# Patient Record
Sex: Female | Born: 1990 | Race: Black or African American | Hispanic: No | Marital: Married | Smoking: Never smoker
Health system: Southern US, Community
[De-identification: ages and names within clinical notes are randomized; demographics above are authoritative.]

## PROBLEM LIST (undated history)

## (undated) ENCOUNTER — Inpatient Hospital Stay (HOSPITAL_COMMUNITY): Payer: Self-pay

## (undated) ENCOUNTER — Inpatient Hospital Stay (HOSPITAL_COMMUNITY): Payer: 59

## (undated) DIAGNOSIS — Z789 Other specified health status: Secondary | ICD-10-CM

## (undated) HISTORY — PX: HERNIA REPAIR: SHX51

---
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
## 2008-08-05 ENCOUNTER — Emergency Department (HOSPITAL_COMMUNITY): Admission: EM | Admit: 2008-08-05 | Discharge: 2008-08-05 | Payer: Self-pay | Admitting: Family Medicine

## 2008-10-22 ENCOUNTER — Emergency Department (HOSPITAL_COMMUNITY): Admission: EM | Admit: 2008-10-22 | Discharge: 2008-10-23 | Payer: Self-pay | Admitting: Emergency Medicine

## 2010-01-25 ENCOUNTER — Emergency Department (HOSPITAL_COMMUNITY): Admission: EM | Admit: 2010-01-25 | Discharge: 2010-01-25 | Payer: Self-pay | Admitting: Emergency Medicine

## 2010-09-18 LAB — URINALYSIS, ROUTINE W REFLEX MICROSCOPIC
Bilirubin Urine: NEGATIVE
Glucose, UA: NEGATIVE mg/dL
Hgb urine dipstick: NEGATIVE
Ketones, ur: NEGATIVE mg/dL
Nitrite: NEGATIVE
Protein, ur: NEGATIVE mg/dL
Specific Gravity, Urine: 1.014 (ref 1.005–1.030)
Urobilinogen, UA: 1 mg/dL (ref 0.0–1.0)
pH: 7 (ref 5.0–8.0)

## 2010-09-18 LAB — DIFFERENTIAL
Basophils Absolute: 0 10*3/uL (ref 0.0–0.1)
Basophils Relative: 1 % (ref 0–1)
Eosinophils Absolute: 0 10*3/uL (ref 0.0–0.7)
Eosinophils Relative: 1 % (ref 0–5)
Lymphocytes Relative: 46 % (ref 12–46)
Lymphs Abs: 1.7 10*3/uL (ref 0.7–4.0)
Monocytes Absolute: 0.3 10*3/uL (ref 0.1–1.0)
Monocytes Relative: 8 % (ref 3–12)
Neutro Abs: 1.6 10*3/uL — ABNORMAL LOW (ref 1.7–7.7)
Neutrophils Relative %: 44 % (ref 43–77)

## 2010-09-18 LAB — COMPREHENSIVE METABOLIC PANEL
ALT: 11 U/L (ref 0–35)
AST: 20 U/L (ref 0–37)
Albumin: 3.9 g/dL (ref 3.5–5.2)
Alkaline Phosphatase: 30 U/L — ABNORMAL LOW (ref 39–117)
BUN: 9 mg/dL (ref 6–23)
CO2: 26 mEq/L (ref 19–32)
Calcium: 8.9 mg/dL (ref 8.4–10.5)
Chloride: 106 mEq/L (ref 96–112)
Creatinine, Ser: 0.64 mg/dL (ref 0.4–1.2)
GFR calc Af Amer: >60 mL/min (ref >60)
GFR calc non Af Amer: >60 mL/min (ref >60)
Glucose, Bld: 87 mg/dL (ref 70–99)
Potassium: 3.9 mEq/L (ref 3.5–5.1)
Sodium: 137 mEq/L (ref 135–145)
Total Bilirubin: 1.1 mg/dL (ref 0.3–1.2)
Total Protein: 6.8 g/dL (ref 6.0–8.3)

## 2010-09-18 LAB — CBC
MCHC: 32.5 g/dL (ref 30.0–36.0)
Platelets: 190 10*3/uL (ref 150–400)
RDW: 15 % (ref 11.5–15.5)
WBC: 3.8 10*3/uL — ABNORMAL LOW (ref 4.0–10.5)

## 2010-09-18 LAB — GC/CHLAMYDIA PROBE AMP, GENITAL
Chlamydia, DNA Probe: POSITIVE — AB
GC Probe Amp, Genital: NEGATIVE

## 2010-09-18 LAB — POCT PREGNANCY, URINE: Preg Test, Ur: NEGATIVE

## 2010-10-13 LAB — URINALYSIS, ROUTINE W REFLEX MICROSCOPIC
Nitrite: NEGATIVE
Specific Gravity, Urine: 1.009 (ref 1.005–1.030)
Urobilinogen, UA: 1 mg/dL (ref 0.0–1.0)
pH: 7 (ref 5.0–8.0)

## 2010-10-13 LAB — URINE MICROSCOPIC-ADD ON

## 2010-10-13 LAB — COMPREHENSIVE METABOLIC PANEL
AST: 20 U/L (ref 0–37)
Albumin: 4.2 g/dL (ref 3.5–5.2)
Calcium: 9.8 mg/dL (ref 8.4–10.5)
Creatinine, Ser: 0.66 mg/dL (ref 0.4–1.2)
GFR calc Af Amer: >60 mL/min (ref >60)

## 2010-10-13 LAB — DIFFERENTIAL
Eosinophils Relative: 1 % (ref 0–5)
Lymphocytes Relative: 51 % — ABNORMAL HIGH (ref 12–46)
Lymphs Abs: 3.2 10*3/uL (ref 0.7–4.0)
Monocytes Absolute: 0.4 10*3/uL (ref 0.1–1.0)
Monocytes Relative: 6 % (ref 3–12)

## 2010-10-13 LAB — CBC
MCHC: 31.8 g/dL (ref 30.0–36.0)
MCV: 72.9 fL — ABNORMAL LOW (ref 78.0–100.0)
Platelets: 212 10*3/uL (ref 150–400)
WBC: 6.3 10*3/uL (ref 4.0–10.5)

## 2010-10-13 LAB — LIPASE, BLOOD: Lipase: 22 U/L (ref 11–59)

## 2010-11-19 NOTE — Discharge Summary (Signed)
Megan Skinner, Megan Skinner               ACCOUNT NO.:  [id number]   MEDICAL RECORD NO.:  [id number]          PATIENT TYPE:  INP   LOCATION:  6148                         FACILITY:  [facility]   PHYSICIAN:  Louise A. Twiselton, M.D.DATE OF BIRTH:  December 10, 1990   DATE OF ADMISSION:  06/03/2006  DATE OF DISCHARGE:  06/06/2006                               DISCHARGE SUMMARY   REASON FOR HOSPITALIZATION:  Diarrhea.   SIGNIFICANT FINDINGS:  A 20 year old African-American female with no  significant past medical history admitted with four days of watery  progressing to mucusy diarrhea, anorexia, diffuse abdominal pain  radiating to back, one episode of frank blood in underwear, and blood-  streaked toilet paper.  Last menstrual period ended four days prior to  admission and is generally regular.  Hemoccult was positive at PCP on  day of admission, but negative in hospital.  Margaretann Loveless antigen was negative.  UA showed large blood, no RBCs, no proteins, small LE.  Fecal  lactoferrin was positive, urine HCG was negative.  Patient was treated  with fluids and ibuprofen.  Urine stool cultures and EHEC toxin were all  negative at time of discharge.  Bloody discharge was discovered to be  vaginal in origin.  Patient had markedly improved abdominal pain, was  tolerating p.o. and diarrhea had resolved on discharge.   No operations or procedures.   FINAL DIAGNOSES:  1. Gastroenteritis/colitis.  2. Irregular menstrual bleeding.   DISCHARGE MEDICATIONS AND INSTRUCTIONS:  Ibuprofen 600 mg p.o. q.6.h.  p.r.n. for pain.   PENDING RESULTS AND ISSUES TO BE FOLLOWED:  1. Urine and stool cultures.  2. Follow up with Dr. Tama High as needed.   DISCHARGE WEIGHT:  56 kilograms.   DISCHARGE CONDITION:  Improved.     ______________________________  Enid Cutter, Resident, Pediatrics    ______________________________  Duanne Guess Twiselton, M.D.    Erick Alley  D:  06/06/2006  T:  06/07/2006  Job:  [id number]

## 2011-04-19 ENCOUNTER — Inpatient Hospital Stay (INDEPENDENT_AMBULATORY_CARE_PROVIDER_SITE_OTHER)
Admission: RE | Admit: 2011-04-19 | Discharge: 2011-04-19 | Disposition: A | Discharge status: Home / Self Care | Payer: 59 | Source: Ambulatory Visit | Attending: Family Medicine | Admitting: Family Medicine

## 2011-04-19 DIAGNOSIS — J069 Acute upper respiratory infection, unspecified: Secondary | ICD-10-CM

## 2011-04-19 DIAGNOSIS — M25569 Pain in unspecified knee: Secondary | ICD-10-CM

## 2011-09-21 ENCOUNTER — Encounter: Payer: Self-pay | Admitting: Obstetrics and Gynecology

## 2012-05-01 LAB — OB RESULTS CONSOLE GC/CHLAMYDIA
Chlamydia: POSITIVE
Gonorrhea: NEGATIVE

## 2012-05-01 LAB — OB RESULTS CONSOLE RUBELLA ANTIBODY, IGM: Rubella: IMMUNE

## 2012-05-01 LAB — OB RESULTS CONSOLE HEPATITIS B SURFACE ANTIGEN: Hepatitis B Surface Ag: NEGATIVE

## 2012-07-12 LAB — OB RESULTS CONSOLE GC/CHLAMYDIA
Chlamydia: NEGATIVE
Gonorrhea: NEGATIVE

## 2012-08-26 ENCOUNTER — Inpatient Hospital Stay (HOSPITAL_COMMUNITY)
Admission: AD | Admit: 2012-08-26 | Discharge: 2012-08-26 | Disposition: A | Discharge status: Home / Self Care | Payer: BC Managed Care – PPO | Source: Ambulatory Visit | Attending: Obstetrics and Gynecology | Admitting: Obstetrics and Gynecology

## 2012-08-26 ENCOUNTER — Encounter (HOSPITAL_COMMUNITY): Payer: Self-pay | Admitting: *Deleted

## 2012-08-26 DIAGNOSIS — R109 Unspecified abdominal pain: Secondary | ICD-10-CM | POA: Insufficient documentation

## 2012-08-26 DIAGNOSIS — O368190 Decreased fetal movements, unspecified trimester, not applicable or unspecified: Secondary | ICD-10-CM | POA: Insufficient documentation

## 2012-08-26 LAB — URINALYSIS, ROUTINE W REFLEX MICROSCOPIC
Bilirubin Urine: NEGATIVE
Glucose, UA: NEGATIVE mg/dL
Hgb urine dipstick: NEGATIVE
Ketones, ur: NEGATIVE mg/dL
Nitrite: NEGATIVE
Protein, ur: NEGATIVE mg/dL
Specific Gravity, Urine: 1.01 (ref 1.005–1.030)
Urobilinogen, UA: 0.2 mg/dL (ref 0.0–1.0)
pH: 6 (ref 5.0–8.0)

## 2012-08-26 LAB — WET PREP, GENITAL: Trich, Wet Prep: NONE SEEN

## 2012-08-26 LAB — URINE MICROSCOPIC-ADD ON

## 2012-08-26 NOTE — MAU Provider Note (Signed)
History     CSN: [id number]  Arrival date and time: 08/26/12 7829   First Provider Initiated Contact with Patient 08/26/12 2006      Chief Complaint  Patient presents with  . Decreased Fetal Movement   HPI [name] is a 22 y.o. G1P0 at [redacted]w[redacted]d who presents to MAU today with complaint of abdominal pressure and decreased fetal movement. The patient states that she has felt increased pressure in her lower abdomen every few hours today. She has not felt the baby move since 10 am today and this is unusual for her. She also feels a tightness across the abdomen, although this remains constant throughout most of the day and does not come and go like a contraction. She denies contractions, vaginal bleeding, abnormal discharge or LOF. She has consumed a normal diet today and feels that she is well hydrated.   OB History   Grav Para Term Preterm Abortions TAB SAB Ect Mult Living   1               History reviewed. No pertinent past medical history.  Past Surgical History  Procedure Laterality Date  . Abdominal hernia repair    . No past surgeries      History reviewed. No pertinent family history.  History  Substance Use Topics  . Smoking status: Never Smoker   . Smokeless tobacco: Not on file  . Alcohol Use: No    Allergies: No Known Allergies  Prescriptions prior to admission  Medication Sig Dispense Refill  . ferrous sulfate 325 (65 FE) MG tablet Take 325 mg by mouth daily with breakfast.      . Prenatal Vit-Fe Fumarate-FA (PRENATAL MULTIVITAMIN) TABS Take 1 tablet by mouth every morning.        Review of Systems  Constitutional: Negative for fever and malaise/fatigue.  Gastrointestinal: Positive for nausea and abdominal pain. Negative for vomiting.  Genitourinary: Negative for dysuria, urgency and frequency.       Neg - vaginal bleeding Neg - abnormal vaginal discharge   Physical Exam   Blood pressure 117/72, pulse 95, temperature 99 F (37.2 C),  temperature source Oral, resp. rate 20, height 5\' 3"  (1.6 m), weight 156 lb (70.761 kg), SpO2 100.00%.  Physical Exam  Constitutional: She is oriented to person, place, and time. She appears well-developed and well-nourished. No distress.  HENT:  Head: Normocephalic and atraumatic.  Cardiovascular: Normal rate, regular rhythm and normal heart sounds.   Respiratory: Effort normal and breath sounds normal. No respiratory distress.  GI: Soft. Bowel sounds are normal. She exhibits no distension and no mass. There is tenderness (mild suprapubic tenderness to palpation). There is no rebound and no guarding.  Genitourinary: Vagina normal. Uterus is enlarged (appropriate for GA) and tender (mild tenderness of the suprapubic region with palpation). Cervix exhibits discharge (moderate amount of frothy off-white discharge noted at the cervical os and in the vaginal vault). Cervix exhibits no motion tenderness and no friability.  Neurological: She is alert and oriented to person, place, and time.  Skin: Skin is warm and dry. No erythema.  Psychiatric: She has a normal mood and affect.  Cervix: Closed, 50% effaced, soft, anterior  Fetal Monitoring: Baseline: 160 bpm, moderate variability, few accelerations, no decelerations Contractions: irritability  Results for orders placed during the hospital encounter of 08/26/12 (from the past 24 hour(s))  URINALYSIS, ROUTINE W REFLEX MICROSCOPIC     Status: Abnormal   Collection Time    08/26/12  7:30  PM      Result Value Range   Color, Urine YELLOW  YELLOW   APPearance HAZY (*) CLEAR   Specific Gravity, Urine 1.010  1.005 - 1.030   pH 6.0  5.0 - 8.0   Glucose, UA NEGATIVE  NEGATIVE mg/dL   Hgb urine dipstick NEGATIVE  NEGATIVE   Bilirubin Urine NEGATIVE  NEGATIVE   Ketones, ur NEGATIVE  NEGATIVE mg/dL   Protein, ur NEGATIVE  NEGATIVE mg/dL   Urobilinogen, UA 0.2  0.0 - 1.0 mg/dL   Nitrite NEGATIVE  NEGATIVE   Leukocytes, UA LARGE (*) NEGATIVE  URINE  MICROSCOPIC-ADD ON     Status: Abnormal   Collection Time    08/26/12  7:30 PM      Result Value Range   Squamous Epithelial / LPF FEW (*) RARE   WBC, UA 11-20  <3 WBC/hpf   RBC / HPF 3-6  <3 RBC/hpf   Bacteria, UA MANY (*) RARE  WET PREP, GENITAL     Status: Abnormal   Collection Time    08/26/12  8:21 PM      Result Value Range   Yeast Wet Prep HPF POC NONE SEEN  NONE SEEN   Trich, Wet Prep NONE SEEN  NONE SEEN   Clue Cells Wet Prep HPF POC FEW (*) NONE SEEN   WBC, Wet Prep HPF POC MANY (*) NONE SEEN    MAU Course  Procedures None  MDM Discussed patient with Dr. Senaida Ores. She would like patient to continue to monitor FM at home tonight and call the office tomorrow if things have not improved by midday. May require additional monitoring at that time.   Assessment and Plan  A: 27 w 2 d GA Decreased fetal movement  P: Discharge home Patient to continue to monitor FM at home Patient may call office tomorrow if there hasn't been much improvement Additional monitoring may be necessary at that time Patient may return to MAU as needed or if her condition were to change or worsen  Freddi Starr, PA-C  08/26/2012, 9:01 PM

## 2012-08-26 NOTE — MAU Note (Signed)
Pt states she started feeling more pelvic pressure today and thebaby has not been moving as much

## 2012-08-28 LAB — URINE CULTURE: Culture: NO GROWTH

## 2012-08-28 LAB — GC/CHLAMYDIA PROBE AMP: GC Probe RNA: NEGATIVE

## 2012-09-11 LAB — OB RESULTS CONSOLE ABO/RH: RH Type: POSITIVE

## 2012-10-26 LAB — OB RESULTS CONSOLE GBS: GBS: POSITIVE

## 2012-11-06 ENCOUNTER — Inpatient Hospital Stay (HOSPITAL_COMMUNITY)
Admission: AD | Admit: 2012-11-06 | Discharge: 2012-11-06 | Disposition: A | Discharge status: Home / Self Care | Payer: 59 | Source: Ambulatory Visit | Attending: Obstetrics and Gynecology | Admitting: Obstetrics and Gynecology

## 2012-11-06 ENCOUNTER — Encounter (HOSPITAL_COMMUNITY): Payer: Self-pay | Admitting: *Deleted

## 2012-11-06 DIAGNOSIS — O479 False labor, unspecified: Secondary | ICD-10-CM | POA: Insufficient documentation

## 2012-11-06 HISTORY — DX: Other specified health status: Z78.9

## 2012-11-06 LAB — URINE MICROSCOPIC-ADD ON

## 2012-11-06 LAB — URINALYSIS, ROUTINE W REFLEX MICROSCOPIC
Bilirubin Urine: NEGATIVE
Glucose, UA: NEGATIVE mg/dL
Hgb urine dipstick: NEGATIVE
Ketones, ur: NEGATIVE mg/dL
Nitrite: NEGATIVE
pH: 6.5 (ref 5.0–8.0)

## 2012-11-06 MED ORDER — ZOLPIDEM TARTRATE 5 MG PO TABS: 5.0000 mg | ORAL_TABLET | Freq: Every evening | ORAL | Status: DC | PRN

## 2012-11-06 NOTE — MAU Provider Note (Signed)
Dr. Senaida Ores requested prescription for Ambien 5mg  #10 given to pt. On discharge Pamelia Hoit, RNC/WHNP

## 2012-11-06 NOTE — MAU Note (Signed)
Contractions since 0630, no bleeding or leaking. Was checked in doctor's office was 1 cm, told to go walking - 2 hrs later was still contracting- called office and instructed to come in here for recheck.

## 2012-11-06 NOTE — MAU Note (Signed)
Pt here after seeing Dr. Jackelyn Knife in office today. Cervix was 1 and long per MD. Has had continued ctx's, no bleeding or lof.

## 2012-11-07 ENCOUNTER — Inpatient Hospital Stay (HOSPITAL_COMMUNITY)
Admission: AD | Admit: 2012-11-07 | Discharge: 2012-11-07 | Disposition: A | Discharge status: Home / Self Care | Payer: 59 | Source: Ambulatory Visit | Attending: Obstetrics and Gynecology | Admitting: Obstetrics and Gynecology

## 2012-11-07 DIAGNOSIS — O479 False labor, unspecified: Secondary | ICD-10-CM

## 2012-11-07 DIAGNOSIS — O368190 Decreased fetal movements, unspecified trimester, not applicable or unspecified: Secondary | ICD-10-CM | POA: Insufficient documentation

## 2012-11-07 DIAGNOSIS — O4700 False labor before 37 completed weeks of gestation, unspecified trimester: Secondary | ICD-10-CM | POA: Insufficient documentation

## 2012-11-07 MED ORDER — OXYCODONE-ACETAMINOPHEN 5-325 MG PO TABS
1.0000 | ORAL_TABLET | Freq: Once | ORAL | Status: AC
  Administered 2012-11-07: 1 via ORAL
  Filled 2012-11-07: qty 1

## 2012-11-07 MED ORDER — OXYCODONE-ACETAMINOPHEN 5-325 MG PO TABS
1.0000 | ORAL_TABLET | Freq: Four times a day (QID) | ORAL | Status: DC | PRN
Start: 2012-11-07 — End: 2012-12-02

## 2012-11-07 NOTE — Progress Notes (Signed)
Eve key, np notified of dr henley's request to write rx.

## 2012-11-07 NOTE — Progress Notes (Signed)
Dr Ambrose Mantle notified of patient, tracing, ctx pattern,sve result and patient request pain medicine. Order to discharge patient home, keep her appt tomorrow. Have eve key, np write prescription for 6 tablets of percocet 5/325mg  po q4h, no refills. Give one tablet here.

## 2012-11-07 NOTE — MAU Note (Signed)
Patient is in with c/o ctx q3-37mins and decreased fetal movement. She denies vaginal bleeding or lof.

## 2012-11-07 NOTE — MAU Provider Note (Signed)
  History     CSN: [id number]  Arrival date and time: 11/07/12 1313   First Provider Initiated Contact with Patient 11/07/12 1412      Chief Complaint  Patient presents with  . Contractions  . Decreased Fetal Movement   HPI Megan Skinner is 22 y.o. G1P0 [redacted]w[redacted]d weeks presenting with abdominal and back pain.  She is a patient of Dr. Eligha Bridegroom.  Was seen in the office yesterday, instructed to walk, warm bath and if it didn't help call back.  She did, and they sent her here for monitoring.  Cervix is unchanged per Peace, RN today.  Pain continues after trying to rest last night.  Has not tried tylenol.  Patient is stable at this time.   Denies vaginal bleeding, leaking of fluid.  + fetal movement.  Has appt in the office tomorrow.    Past Medical History  Diagnosis Date  . Medical history non-contributory     Past Surgical History  Procedure Laterality Date  . Abdominal hernia repair      Family History  Problem Relation Age of Onset  . Diabetes Father   . Stroke Daughter   . Diabetes Maternal Grandmother   . Hyperlipidemia Maternal Grandmother   . Cancer Maternal Grandmother     peritoneal  . Diabetes Maternal Grandfather   . Hyperlipidemia Maternal Grandfather   . Stroke Maternal Grandfather   . Heart disease Maternal Grandfather   . Heart disease Paternal Grandmother   . Hearing loss Neg Hx     History  Substance Use Topics  . Smoking status: Never Smoker   . Smokeless tobacco: Never Used  . Alcohol Use: No    Allergies: No Known Allergies  Prescriptions prior to admission  Medication Sig Dispense Refill  . Prenatal Vit-Fe Fumarate-FA (PRENATAL MULTIVITAMIN) TABS Take 1 tablet by mouth every morning.      . zolpidem (AMBIEN) 5 MG tablet Take 1 tablet (5 mg total) by mouth at bedtime as needed for sleep.  10 tablet  0    Review of Systems  Gastrointestinal: Positive for abdominal pain.   Physical Exam   There were no vitals taken for this  visit.  Physical Exam  MAU Course  Procedures  MDM Medical Screening exam completed.   Dr. Ambrose Mantle gave order for Percocet 5/25mg  po now and to send her home with Rx for #6.  Patient to keep scheduled appointment with Dr. Jackelyn Knife tomorrow.    Assessment and Plan    Jasiel Belisle,EVE M 11/07/2012, 2:15 PM

## 2012-11-16 ENCOUNTER — Ambulatory Visit (HOSPITAL_COMMUNITY)
Admission: RE | Admit: 2012-11-16 | Discharge: 2012-11-16 | Disposition: A | Discharge status: Home / Self Care | Payer: 59 | Source: Ambulatory Visit | Attending: Obstetrics and Gynecology | Admitting: Obstetrics and Gynecology

## 2012-11-16 ENCOUNTER — Other Ambulatory Visit (HOSPITAL_COMMUNITY): Payer: Self-pay | Admitting: Obstetrics and Gynecology

## 2012-11-16 DIAGNOSIS — O368390 Maternal care for abnormalities of the fetal heart rate or rhythm, unspecified trimester, not applicable or unspecified: Secondary | ICD-10-CM | POA: Insufficient documentation

## 2012-11-16 DIAGNOSIS — O288 Other abnormal findings on antenatal screening of mother: Secondary | ICD-10-CM

## 2012-11-16 DIAGNOSIS — Z3689 Encounter for other specified antenatal screening: Secondary | ICD-10-CM | POA: Insufficient documentation

## 2012-11-19 ENCOUNTER — Telehealth (HOSPITAL_COMMUNITY): Payer: Self-pay | Admitting: *Deleted

## 2012-11-19 ENCOUNTER — Encounter (HOSPITAL_COMMUNITY): Payer: Self-pay | Admitting: *Deleted

## 2012-11-19 NOTE — Telephone Encounter (Signed)
Preadmission screen  

## 2012-11-23 ENCOUNTER — Encounter (HOSPITAL_COMMUNITY): Payer: Self-pay | Admitting: *Deleted

## 2012-11-23 ENCOUNTER — Inpatient Hospital Stay (HOSPITAL_COMMUNITY)
Admission: AD | Admit: 2012-11-23 | Discharge: 2012-11-23 | Disposition: A | Discharge status: Home / Self Care | Payer: 59 | Source: Ambulatory Visit | Attending: Obstetrics and Gynecology | Admitting: Obstetrics and Gynecology

## 2012-11-23 ENCOUNTER — Inpatient Hospital Stay (HOSPITAL_COMMUNITY): Payer: 59

## 2012-11-23 DIAGNOSIS — O479 False labor, unspecified: Secondary | ICD-10-CM | POA: Insufficient documentation

## 2012-11-23 LAB — POCT FERN TEST: POCT Fern Test: NEGATIVE

## 2012-11-23 NOTE — MAU Provider Note (Signed)
HPI: Megan Skinner is a 22 y.o. year old G1P0 female at [redacted]w[redacted]d weeks gestation who presents to MAU reporting contractions and 1 episode of leaking fluid. Dr. Ambrose Mantle came to MAU to review FHR tracing and BPP. (BPP 8/8. Neg CST)  CNM performed SVE to R/O ROM. Neg pool [employer].   Assessment: Intact membranes.  Plan:  D/C home in stable condition. FKCs. Labor precautions.  F/U as scheduled or as needed.   [place], [place] 11/23/2012 11:37 PM

## 2012-11-23 NOTE — MAU Note (Signed)
Contractions since 1330 that were off and on. More regular since 1700.

## 2012-11-23 NOTE — MAU Note (Signed)
Dr. Ambrose Mantle at bedside to discuss patients concerns, BPP results and plan of care. Patient instructed to return to tomorrow 5/24 for an NST.

## 2012-11-28 ENCOUNTER — Inpatient Hospital Stay (HOSPITAL_COMMUNITY)
Admission: AD | Admit: 2012-11-28 | Discharge: 2012-12-02 | DRG: 371 | Disposition: A | Discharge status: Home / Self Care | Payer: BC Managed Care – PPO | Source: Ambulatory Visit | Attending: Obstetrics and Gynecology | Admitting: Obstetrics and Gynecology

## 2012-11-28 ENCOUNTER — Encounter (HOSPITAL_COMMUNITY): Payer: Self-pay | Admitting: *Deleted

## 2012-11-28 DIAGNOSIS — Z22330 Carrier of Group B streptococcus: Secondary | ICD-10-CM

## 2012-11-28 DIAGNOSIS — Z98891 History of uterine scar from previous surgery: Secondary | ICD-10-CM

## 2012-11-28 DIAGNOSIS — O99892 Other specified diseases and conditions complicating childbirth: Secondary | ICD-10-CM | POA: Diagnosis present

## 2012-11-28 LAB — CBC
HCT: 37.3 % (ref 36.0–46.0)
MCH: 23.7 pg — ABNORMAL LOW (ref 26.0–34.0)
MCHC: 31.9 g/dL (ref 30.0–36.0)
MCV: 74.3 fL — ABNORMAL LOW (ref 78.0–100.0)
Platelets: 139 10*3/uL — ABNORMAL LOW (ref 150–400)
RDW: 15.9 % — ABNORMAL HIGH (ref 11.5–15.5)
WBC: 8.2 10*3/uL (ref 4.0–10.5)

## 2012-11-28 MED ORDER — CITRIC ACID-SODIUM CITRATE 334-500 MG/5ML PO SOLN
30.0000 mL | ORAL | Status: DC | PRN
  Administered 2012-11-29: 30 mL via ORAL
  Filled 2012-11-28: qty 15

## 2012-11-28 MED ORDER — PENICILLIN G POTASSIUM 5000000 UNITS IJ SOLR
5.0000 10*6.[IU] | Freq: Once | INTRAVENOUS | Status: AC
  Administered 2012-11-28: 5 10*6.[IU] via INTRAVENOUS
  Filled 2012-11-28: qty 5

## 2012-11-28 MED ORDER — EPHEDRINE 5 MG/ML INJ
10.0000 mg | INTRAVENOUS | Status: DC | PRN
  Filled 2012-11-28: qty 4

## 2012-11-28 MED ORDER — OXYTOCIN 40 UNITS IN LACTATED RINGERS INFUSION - SIMPLE MED
1.0000 m[IU]/min | INTRAVENOUS | Status: DC
  Administered 2012-11-28: 2 m[IU]/min via INTRAVENOUS
  Administered 2012-11-29: 8 m[IU]/min via INTRAVENOUS
  Administered 2012-11-29: 2 m[IU]/min via INTRAVENOUS
  Administered 2012-11-29: 4 m[IU]/min via INTRAVENOUS
  Filled 2012-11-28: qty 1000

## 2012-11-28 MED ORDER — OXYTOCIN 40 UNITS IN LACTATED RINGERS INFUSION - SIMPLE MED: 62.5000 mL/h | INTRAVENOUS | Status: DC

## 2012-11-28 MED ORDER — LIDOCAINE HCL (PF) 1 % IJ SOLN
30.0000 mL | INTRAMUSCULAR | Status: DC | PRN
  Filled 2012-11-28: qty 30

## 2012-11-28 MED ORDER — PHENYLEPHRINE 40 MCG/ML (10ML) SYRINGE FOR IV PUSH (FOR BLOOD PRESSURE SUPPORT)
80.0000 ug | PREFILLED_SYRINGE | INTRAVENOUS | Status: DC | PRN
  Filled 2012-11-28: qty 5

## 2012-11-28 MED ORDER — OXYCODONE-ACETAMINOPHEN 5-325 MG PO TABS: 1.0000 | ORAL_TABLET | ORAL | Status: DC | PRN

## 2012-11-28 MED ORDER — PHENYLEPHRINE 40 MCG/ML (10ML) SYRINGE FOR IV PUSH (FOR BLOOD PRESSURE SUPPORT): 80.0000 ug | PREFILLED_SYRINGE | INTRAVENOUS | Status: DC | PRN

## 2012-11-28 MED ORDER — TERBUTALINE SULFATE 1 MG/ML IJ SOLN: 0.2500 mg | Freq: Once | INTRAMUSCULAR | Status: AC | PRN

## 2012-11-28 MED ORDER — LACTATED RINGERS IV SOLN
500.0000 mL | INTRAVENOUS | Status: DC | PRN
  Administered 2012-11-29: 500 mL via INTRAVENOUS

## 2012-11-28 MED ORDER — LACTATED RINGERS IV SOLN
500.0000 mL | Freq: Once | INTRAVENOUS | Status: AC
  Administered 2012-11-28: 500 mL via INTRAVENOUS

## 2012-11-28 MED ORDER — EPHEDRINE 5 MG/ML INJ: 10.0000 mg | INTRAVENOUS | Status: DC | PRN

## 2012-11-28 MED ORDER — FENTANYL 2.5 MCG/ML BUPIVACAINE 1/10 % EPIDURAL INFUSION (WH - ANES)
14.0000 mL/h | INTRAMUSCULAR | Status: DC | PRN
  Filled 2012-11-28: qty 125

## 2012-11-28 MED ORDER — IBUPROFEN 600 MG PO TABS: 600.0000 mg | ORAL_TABLET | Freq: Four times a day (QID) | ORAL | Status: DC | PRN

## 2012-11-28 MED ORDER — OXYTOCIN BOLUS FROM INFUSION: 500.0000 mL | INTRAVENOUS | Status: DC

## 2012-11-28 MED ORDER — ONDANSETRON HCL 4 MG/2ML IJ SOLN: 4.0000 mg | Freq: Four times a day (QID) | INTRAMUSCULAR | Status: DC | PRN

## 2012-11-28 MED ORDER — PENICILLIN G POTASSIUM 5000000 UNITS IJ SOLR
2.5000 10*6.[IU] | INTRAVENOUS | Status: DC
  Administered 2012-11-28 – 2012-11-29 (×3): 2.5 10*6.[IU] via INTRAVENOUS
  Filled 2012-11-28 (×6): qty 2.5

## 2012-11-28 MED ORDER — DIPHENHYDRAMINE HCL 50 MG/ML IJ SOLN: 12.5000 mg | INTRAMUSCULAR | Status: DC | PRN

## 2012-11-28 MED ORDER — ACETAMINOPHEN 325 MG PO TABS: 650.0000 mg | ORAL_TABLET | ORAL | Status: DC | PRN

## 2012-11-28 MED ORDER — LACTATED RINGERS IV SOLN
INTRAVENOUS | Status: DC
  Administered 2012-11-28: 125 mL/h via INTRAVENOUS
  Administered 2012-11-28 – 2012-11-29 (×4): via INTRAVENOUS

## 2012-11-28 NOTE — MAU Note (Signed)
Patient states she started leaking clear fluid last night at 2200. Was seen in the office today and could not determine positive for rupture. Was sent walking and states she has continued to leak. Having some mild contractions and reports good fetal movement.

## 2012-11-28 NOTE — MAU Note (Signed)
Denies lof at present.

## 2012-11-28 NOTE — Progress Notes (Signed)
   Subjective: Pt getting uncomfortable and would like epidural.    Objective: BP 105/64  Pulse 94  Temp(Src) 98 F (36.7 C) (Oral)  Resp 20  Ht 5\' 3"  (1.6 m)  Wt 79.017 kg (174 lb 3.2 oz)  BMI 30.87 kg/m2  SpO2 100%      FHT:  FHR: 145 bpm, variability: minimal ,  accelerations:  Present,  decelerations:  Present intermittent mild variables UC:   regular, every 1-2 minutes SVE:   Dilation: 2.5 Effacement (%): 80 Station: -2 Exam by:: Dr Senaida Ores  Labs: Lab Results  Component Value Date   WBC 8.2 11/28/2012   HGB 11.9* 11/28/2012   HCT 37.3 11/28/2012   MCV 74.3* 11/28/2012   PLT 139* 11/28/2012    Assessment / Plan: Pt just beginning to get uncomfortable in last hour.  Fetal tracing has never had consistent variability but does have accelerations and no significant decelerations, will continue to follow closely.  To get epidural.  Oliver Pila 11/28/2012, 10:55 PM

## 2012-11-28 NOTE — H&P (Signed)
Megan Skinner is a 22 y.o. female G1P0 at 13 5/7 weeks (EDD 11/23/12 by LMP c/w [place] at 9 weeks)  presenting for augmentation given SROM.  Pt first noted LOF last PM at 2200.  She was seen in office and this AM but ROM could not be confirmed and pt sent to walk.  Continued to leak so seen at MAU and amniosure +.  Currently having minimal contractions.  Prenatal care uncomplicated except GBS positive.  Had +chlam in 11/13 but treated with negative test of cure.  Maternal Medical History:  Reason for admission: Rupture of membranes.   Contractions: Onset was yesterday.   Frequency: irregular.   Perceived severity is mild.    Fetal activity: Perceived fetal activity is normal.    Prenatal Complications - Diabetes: none.    OB History   Grav Para Term Preterm Abortions TAB SAB Ect Mult Living   1         0     Past Medical History  Diagnosis Date  . Medical history non-contributory    Past Surgical History  Procedure Laterality Date  . Abdominal hernia repair     Family History: family history includes Anemia in her mother; Arthritis in her paternal grandfather; Cancer in her maternal grandmother; Depression in her maternal aunt; Diabetes in her father, maternal grandfather, maternal grandmother, and paternal grandfather; Heart attack in her maternal grandfather and paternal grandmother; Heart disease in her maternal grandfather and paternal grandmother; Hyperlipidemia in her maternal grandfather and maternal grandmother; Hypertension in her maternal grandmother, mother, and paternal grandmother; and Stroke in her maternal grandfather.  There is no history of Hearing loss. Social History:  reports that she has never smoked. She has never used smokeless tobacco. She reports that she does not drink alcohol or use illicit drugs.   Prenatal Transfer Tool  Maternal Diabetes: No Genetic Screening: Normal Maternal Ultrasounds/Referrals: Normal Fetal Ultrasounds or other Referrals:   None Maternal Substance Abuse:  No Significant Maternal Medications:  None Significant Maternal Lab Results:  Lab values include: Other: + chlam 11/13, rx with negative TOC Other Comments:  None  ROS  Dilation: 2 Effacement (%): 50 Station: -2 Exam by:: dr. Senaida Ores Blood pressure 129/72, pulse 96, temperature 98.3 F (36.8 C), temperature source Oral, resp. rate 20, height 5\' 3"  (1.6 m), weight 79.017 kg (174 lb 3.2 oz), SpO2 100.00%. Maternal Exam:  Uterine Assessment: Contraction strength is mild.  Contraction frequency is irregular.   Abdomen: Patient reports no abdominal tenderness. Fetal presentation: vertex  Introitus: Normal vulva. Normal vagina.    Physical Exam  Constitutional: She is oriented to person, place, and time. She appears well-developed and well-nourished.  Cardiovascular: Normal rate and regular rhythm.   Respiratory: Effort normal and breath sounds normal.  GI: Soft.  Genitourinary: Vagina normal.  Uterus gravid  Neurological: She is alert and oriented to person, place, and time.  Psychiatric: She has a normal mood and affect. Her behavior is normal.    Prenatal labs: ABO, Rh: A/Positive/-- (03/11 0000) Antibody: Negative (10/29 0000) Rubella: Immune (10/29 0000) RPR: Nonreactive (10/29 0000)  HBsAg: Negative (10/29 0000)  HIV: Non-reactive (10/29 0000)  GBS: Positive (04/25 0000)  One hour GTT 105 Quad screen WNL CF negative Hgb AA  Assessment/Plan: Pt admitted with prolonged ROM for augmentation.  Will start pitocin.  On PCN for +GBS.  Plans epidural.   Oliver Pila 11/28/2012, 4:56 PM

## 2012-11-29 ENCOUNTER — Inpatient Hospital Stay (HOSPITAL_COMMUNITY): Admission: RE | Admit: 2012-11-29 | Payer: 59 | Source: Ambulatory Visit

## 2012-11-29 ENCOUNTER — Encounter (HOSPITAL_COMMUNITY): Payer: Self-pay | Admitting: Anesthesiology

## 2012-11-29 ENCOUNTER — Encounter (HOSPITAL_COMMUNITY)
Admission: AD | Disposition: A | Discharge status: Home / Self Care | Payer: Self-pay | Source: Ambulatory Visit | Attending: Obstetrics and Gynecology

## 2012-11-29 ENCOUNTER — Inpatient Hospital Stay (HOSPITAL_COMMUNITY): Payer: BC Managed Care – PPO | Admitting: Anesthesiology

## 2012-11-29 LAB — PLATELET COUNT: Platelets: 115 10*3/uL — ABNORMAL LOW (ref 150–400)

## 2012-11-29 SURGERY — Surgical Case
Anesthesia: Epidural | Site: Abdomen | Wound class: Clean Contaminated

## 2012-11-29 MED ORDER — MORPHINE SULFATE (PF) 0.5 MG/ML IJ SOLN
INTRAMUSCULAR | Status: DC | PRN
  Administered 2012-11-29: 4 mg via EPIDURAL

## 2012-11-29 MED ORDER — TETANUS-DIPHTH-ACELL PERTUSSIS 5-2.5-18.5 LF-MCG/0.5 IM SUSP: 0.5000 mL | Freq: Once | INTRAMUSCULAR | Status: DC

## 2012-11-29 MED ORDER — SCOPOLAMINE 1 MG/3DAYS TD PT72
MEDICATED_PATCH | TRANSDERMAL | Status: AC
  Administered 2012-11-29: 1.5 mg via TRANSDERMAL
  Filled 2012-11-29: qty 1

## 2012-11-29 MED ORDER — IBUPROFEN 600 MG PO TABS
600.0000 mg | ORAL_TABLET | Freq: Four times a day (QID) | ORAL | Status: DC
Start: 2012-11-30 — End: 2012-12-02
  Administered 2012-11-30 – 2012-12-02 (×9): 600 mg via ORAL
  Filled 2012-11-29 (×8): qty 1

## 2012-11-29 MED ORDER — SENNOSIDES-DOCUSATE SODIUM 8.6-50 MG PO TABS
2.0000 | ORAL_TABLET | Freq: Every day | ORAL | Status: DC
  Administered 2012-12-01 (×2): 2 via ORAL

## 2012-11-29 MED ORDER — SODIUM CHLORIDE 0.9 % IV SOLN
3.0000 g | Freq: Four times a day (QID) | INTRAVENOUS | Status: DC
  Administered 2012-11-29 (×3): 3 g via INTRAVENOUS
  Filled 2012-11-29 (×4): qty 3

## 2012-11-29 MED ORDER — ACETAMINOPHEN 500 MG PO TABS
1000.0000 mg | ORAL_TABLET | Freq: Four times a day (QID) | ORAL | Status: DC | PRN
  Administered 2012-11-29: 1000 mg via ORAL
  Filled 2012-11-29 (×2): qty 2

## 2012-11-29 MED ORDER — KETOROLAC TROMETHAMINE 30 MG/ML IJ SOLN: 30.0000 mg | Freq: Four times a day (QID) | INTRAMUSCULAR | Status: AC | PRN

## 2012-11-29 MED ORDER — LIDOCAINE HCL (PF) 1 % IJ SOLN
INTRAMUSCULAR | Status: DC | PRN
  Administered 2012-11-28 (×2): 3 mL

## 2012-11-29 MED ORDER — DIPHENHYDRAMINE HCL 50 MG/ML IJ SOLN: 25.0000 mg | INTRAMUSCULAR | Status: DC | PRN

## 2012-11-29 MED ORDER — ACETAMINOPHEN 10 MG/ML IV SOLN: 1000.0000 mg | Freq: Four times a day (QID) | INTRAVENOUS | Status: AC | PRN

## 2012-11-29 MED ORDER — KETOROLAC TROMETHAMINE 60 MG/2ML IM SOLN
60.0000 mg | Freq: Once | INTRAMUSCULAR | Status: AC | PRN
  Filled 2012-11-29: qty 2

## 2012-11-29 MED ORDER — LACTATED RINGERS IV SOLN
INTRAVENOUS | Status: DC
  Administered 2012-11-30: via INTRAVENOUS

## 2012-11-29 MED ORDER — SODIUM CHLORIDE 0.9 % IV SOLN
3.0000 g | Freq: Four times a day (QID) | INTRAVENOUS | Status: DC
  Administered 2012-11-30 – 2012-12-01 (×6): 3 g via INTRAVENOUS
  Filled 2012-11-29 (×7): qty 3

## 2012-11-29 MED ORDER — PROMETHAZINE HCL 25 MG/ML IJ SOLN: 6.2500 mg | INTRAMUSCULAR | Status: DC | PRN

## 2012-11-29 MED ORDER — MEPERIDINE HCL 25 MG/ML IJ SOLN
INTRAMUSCULAR | Status: DC | PRN
  Administered 2012-11-29 (×2): 12.5 mg via INTRAVENOUS

## 2012-11-29 MED ORDER — MENTHOL 3 MG MT LOZG: 1.0000 | LOZENGE | OROMUCOSAL | Status: DC | PRN

## 2012-11-29 MED ORDER — NALOXONE HCL 0.4 MG/ML IJ SOLN: 0.4000 mg | INTRAMUSCULAR | Status: DC | PRN

## 2012-11-29 MED ORDER — ONDANSETRON HCL 4 MG/2ML IJ SOLN
INTRAMUSCULAR | Status: DC | PRN
  Administered 2012-11-29: 4 mg via INTRAVENOUS

## 2012-11-29 MED ORDER — MEASLES, MUMPS & RUBELLA VAC ~~LOC~~ INJ: 0.5000 mL | INJECTION | Freq: Once | SUBCUTANEOUS | Status: DC

## 2012-11-29 MED ORDER — MEPERIDINE HCL 25 MG/ML IJ SOLN
INTRAMUSCULAR | Status: AC
  Filled 2012-11-29: qty 2

## 2012-11-29 MED ORDER — NALBUPHINE HCL 10 MG/ML IJ SOLN: 5.0000 mg | INTRAMUSCULAR | Status: DC | PRN

## 2012-11-29 MED ORDER — DIPHENHYDRAMINE HCL 25 MG PO CAPS: 25.0000 mg | ORAL_CAPSULE | ORAL | Status: DC | PRN

## 2012-11-29 MED ORDER — OXYTOCIN 10 UNIT/ML IJ SOLN
40.0000 [IU] | INTRAVENOUS | Status: DC | PRN
  Administered 2012-11-29: 40 [IU] via INTRAVENOUS

## 2012-11-29 MED ORDER — MEPERIDINE HCL 25 MG/ML IJ SOLN: 6.2500 mg | INTRAMUSCULAR | Status: DC | PRN

## 2012-11-29 MED ORDER — NALOXONE HCL 1 MG/ML IJ SOLN: 1.0000 ug/kg/h | INTRAVENOUS | Status: DC | PRN

## 2012-11-29 MED ORDER — SIMETHICONE 80 MG PO CHEW: 80.0000 mg | CHEWABLE_TABLET | ORAL | Status: DC | PRN

## 2012-11-29 MED ORDER — ONDANSETRON HCL 4 MG/2ML IJ SOLN
INTRAMUSCULAR | Status: AC
  Filled 2012-11-29: qty 2

## 2012-11-29 MED ORDER — KETOROLAC TROMETHAMINE 30 MG/ML IJ SOLN: 15.0000 mg | Freq: Once | INTRAMUSCULAR | Status: AC | PRN

## 2012-11-29 MED ORDER — HYDROMORPHONE HCL PF 1 MG/ML IJ SOLN: 0.2500 mg | INTRAMUSCULAR | Status: DC | PRN

## 2012-11-29 MED ORDER — ONDANSETRON HCL 4 MG PO TABS: 4.0000 mg | ORAL_TABLET | ORAL | Status: DC | PRN

## 2012-11-29 MED ORDER — DIBUCAINE 1 % RE OINT: 1.0000 "application " | TOPICAL_OINTMENT | RECTAL | Status: DC | PRN

## 2012-11-29 MED ORDER — FENTANYL 2.5 MCG/ML BUPIVACAINE 1/10 % EPIDURAL INFUSION (WH - ANES)
12.0000 mL/h | INTRAMUSCULAR | Status: DC | PRN
  Administered 2012-11-29: 12 mL/h via EPIDURAL
  Filled 2012-11-29: qty 125

## 2012-11-29 MED ORDER — SODIUM BICARBONATE 8.4 % IV SOLN
INTRAVENOUS | Status: AC
  Filled 2012-11-29: qty 50

## 2012-11-29 MED ORDER — MORPHINE SULFATE 0.5 MG/ML IJ SOLN
INTRAMUSCULAR | Status: AC
  Filled 2012-11-29: qty 10

## 2012-11-29 MED ORDER — DIPHENHYDRAMINE HCL 25 MG PO CAPS: 25.0000 mg | ORAL_CAPSULE | Freq: Four times a day (QID) | ORAL | Status: DC | PRN

## 2012-11-29 MED ORDER — SCOPOLAMINE 1 MG/3DAYS TD PT72: 1.0000 | MEDICATED_PATCH | Freq: Once | TRANSDERMAL | Status: DC

## 2012-11-29 MED ORDER — SODIUM CHLORIDE 0.9 % IJ SOLN: 3.0000 mL | INTRAMUSCULAR | Status: DC | PRN

## 2012-11-29 MED ORDER — LIDOCAINE-EPINEPHRINE (PF) 2 %-1:200000 IJ SOLN
INTRAMUSCULAR | Status: AC
  Filled 2012-11-29: qty 20

## 2012-11-29 MED ORDER — OXYTOCIN 10 UNIT/ML IJ SOLN
INTRAMUSCULAR | Status: AC
  Filled 2012-11-29: qty 4

## 2012-11-29 MED ORDER — FENTANYL 2.5 MCG/ML BUPIVACAINE 1/10 % EPIDURAL INFUSION (WH - ANES)
INTRAMUSCULAR | Status: DC | PRN
  Administered 2012-11-28: 12 mL/h via EPIDURAL

## 2012-11-29 MED ORDER — LANOLIN HYDROUS EX OINT: 1.0000 "application " | TOPICAL_OINTMENT | CUTANEOUS | Status: DC | PRN

## 2012-11-29 MED ORDER — KETOROLAC TROMETHAMINE 30 MG/ML IJ SOLN
INTRAMUSCULAR | Status: AC
  Administered 2012-11-29: 30 mg via INTRAVENOUS
  Filled 2012-11-29: qty 1

## 2012-11-29 MED ORDER — OXYCODONE-ACETAMINOPHEN 5-325 MG PO TABS
1.0000 | ORAL_TABLET | ORAL | Status: DC | PRN
  Administered 2012-11-30 – 2012-12-02 (×4): 1 via ORAL
  Filled 2012-11-29 (×4): qty 1

## 2012-11-29 MED ORDER — SODIUM BICARBONATE 8.4 % IV SOLN
INTRAVENOUS | Status: DC | PRN
  Administered 2012-11-29: 3 mL via EPIDURAL

## 2012-11-29 MED ORDER — LACTATED RINGERS IV SOLN
INTRAVENOUS | Status: DC | PRN
  Administered 2012-11-29: 18:00:00 via INTRAVENOUS

## 2012-11-29 MED ORDER — ONDANSETRON HCL 4 MG/2ML IJ SOLN: 4.0000 mg | Freq: Three times a day (TID) | INTRAMUSCULAR | Status: DC | PRN

## 2012-11-29 MED ORDER — MORPHINE SULFATE (PF) 0.5 MG/ML IJ SOLN
INTRAMUSCULAR | Status: DC | PRN
  Administered 2012-11-29: 1 mg via INTRAVENOUS

## 2012-11-29 MED ORDER — SIMETHICONE 80 MG PO CHEW
80.0000 mg | CHEWABLE_TABLET | Freq: Three times a day (TID) | ORAL | Status: DC
  Administered 2012-11-30 – 2012-12-01 (×7): 80 mg via ORAL

## 2012-11-29 MED ORDER — OXYTOCIN 40 UNITS IN LACTATED RINGERS INFUSION - SIMPLE MED: 62.5000 mL/h | INTRAVENOUS | Status: AC

## 2012-11-29 MED ORDER — METOCLOPRAMIDE HCL 5 MG/ML IJ SOLN: 10.0000 mg | Freq: Three times a day (TID) | INTRAMUSCULAR | Status: DC | PRN

## 2012-11-29 MED ORDER — ZOLPIDEM TARTRATE 5 MG PO TABS: 5.0000 mg | ORAL_TABLET | Freq: Every evening | ORAL | Status: DC | PRN

## 2012-11-29 MED ORDER — ONDANSETRON HCL 4 MG/2ML IJ SOLN: 4.0000 mg | INTRAMUSCULAR | Status: DC | PRN

## 2012-11-29 MED ORDER — DIPHENHYDRAMINE HCL 50 MG/ML IJ SOLN: 12.5000 mg | INTRAMUSCULAR | Status: DC | PRN

## 2012-11-29 MED ORDER — WITCH HAZEL-GLYCERIN EX PADS: 1.0000 "application " | MEDICATED_PAD | CUTANEOUS | Status: DC | PRN

## 2012-11-29 SURGICAL SUPPLY — 32 items
CLAMP CORD UMBIL (MISCELLANEOUS) IMPLANT
CLOTH BEACON ORANGE TIMEOUT ST (SAFETY) ×2 IMPLANT
CONTAINER PREFILL 10% NBF 15ML (MISCELLANEOUS) IMPLANT
DRAPE LG THREE QUARTER DISP (DRAPES) ×2 IMPLANT
DRSG OPSITE 6X11 MED (GAUZE/BANDAGES/DRESSINGS) ×2 IMPLANT
DRSG OPSITE POSTOP 4X10 (GAUZE/BANDAGES/DRESSINGS) ×2 IMPLANT
DRSG VASELINE 3X18 (GAUZE/BANDAGES/DRESSINGS) ×2 IMPLANT
DURAPREP 26ML APPLICATOR (WOUND CARE) ×2 IMPLANT
ELECT REM PT RETURN 9FT ADLT (ELECTROSURGICAL) ×2
ELECTRODE REM PT RTRN 9FT ADLT (ELECTROSURGICAL) ×1 IMPLANT
EXTRACTOR VACUUM KIWI (MISCELLANEOUS) IMPLANT
EXTRACTOR VACUUM M CUP 4 TUBE (SUCTIONS) IMPLANT
GLOVE BIO SURGEON STRL SZ7.5 (GLOVE) ×2 IMPLANT
GOWN PREVENTION PLUS XLARGE (GOWN DISPOSABLE) ×2 IMPLANT
GOWN STRL REIN XL XLG (GOWN DISPOSABLE) ×4 IMPLANT
KIT ABG SYR 3ML LUER SLIP (SYRINGE) IMPLANT
NEEDLE HYPO 25X5/8 SAFETYGLIDE (NEEDLE) IMPLANT
NS IRRIG 1000ML POUR BTL (IV SOLUTION) ×2 IMPLANT
PACK C SECTION WH (CUSTOM PROCEDURE TRAY) ×2 IMPLANT
PAD OB MATERNITY 4.3X12.25 (PERSONAL CARE ITEMS) ×2 IMPLANT
RTRCTR C-SECT PINK 25CM LRG (MISCELLANEOUS) ×2 IMPLANT
STAPLER VISISTAT 35W (STAPLE) ×2 IMPLANT
SUT PLAIN 0 NONE (SUTURE) IMPLANT
SUT VIC AB 0 CT1 36 (SUTURE) ×14 IMPLANT
SUT VIC AB 3-0 CTX 36 (SUTURE) ×2 IMPLANT
SUT VIC AB 3-0 SH 27 (SUTURE) ×1
SUT VIC AB 3-0 SH 27X BRD (SUTURE) ×1 IMPLANT
SUT VIC AB 4-0 KS 27 (SUTURE) IMPLANT
SUT VICRYL 0 TIES 12 18 (SUTURE) IMPLANT
TOWEL OR 17X24 6PK STRL BLUE (TOWEL DISPOSABLE) ×6 IMPLANT
TRAY FOLEY CATH 14FR (SET/KITS/TRAYS/PACK) IMPLANT
WATER STERILE IRR 1000ML POUR (IV SOLUTION) IMPLANT

## 2012-11-29 NOTE — Anesthesia Procedure Notes (Signed)
Epidural Patient location during procedure: OB Start time: 11/28/2012 11:20 PM End time: 11/28/2012 11:35 PM  Staffing Anesthesiologist: Lewie Loron R  Preanesthetic Checklist Completed: patient identified, surgical consent, pre-op evaluation, timeout performed, IV checked, risks and benefits discussed and monitors and equipment checked  Epidural Patient position: sitting Prep: site prepped and draped and DuraPrep Patient monitoring: heart rate, continuous pulse ox and blood pressure Approach: midline Injection technique: LOR air and LOR saline  Needle:  Needle type: Tuohy  Needle gauge: 17 G Needle length: 9 cm Needle insertion depth: 6 cm Catheter type: closed end flexible Catheter size: 19 Gauge Catheter at skin depth: 12 cm Test dose: negative  Assessment Sensory level: T8 Events: blood not aspirated, injection not painful, no injection resistance, negative IV test and no paresthesia  Additional Notes Reason for block:procedure for pain

## 2012-11-29 NOTE — Consult Note (Addendum)
Neonatology Note:  Attendance at C-section:  I was asked by Dr. Henley to attend this primary C/S at 40 6/[redacted] weeks GA due to FTP. The mother is a G1P0 A pos, GBS pos and got Pen G during labor. ROM thought to be 20 hours prior to delivery (delivery record states ROM on 5/27, but ROM could not be confirmed in office yesterday morning), fluid clear. The mother had a temperature of 100.9 during labor and received a dose of Unasyn with decline in her temperature. Infant vigorous with good spontaneous cry and tone. Needed only minimal bulb suctioning. Ap 9/9. Lungs clear to ausc in DR. To CN to care of Pediatrician.  [employer] C. Dorthia Tout, MD  

## 2012-11-29 NOTE — Transfer of Care (Signed)
Immediate Anesthesia Transfer of Care Note  Patient: Megan Skinner  Procedure(s) Performed: Procedure(s): CESAREAN SECTION (N/A)  Patient Location: PACU  Anesthesia Type:Epidural  Level of Consciousness: awake  Airway & Oxygen Therapy: Patient Spontanous Breathing  Post-op Assessment: Report given to PACU RN and Post -op Vital signs reviewed and stable  Post vital signs: stable  Complications: No apparent anesthesia complications

## 2012-11-29 NOTE — Progress Notes (Signed)
Patient ID: Megan Skinner, female   DOB: [birth year], 22 y.o.   MRN: [id number] The contractions are adequate and q 2-3 minutes The cervix is 6-7 cm 80% effaced and the vertex is at -1 station Will proceed with c section for failure to progress in labor.

## 2012-11-29 NOTE — Progress Notes (Signed)
Spoke with Dr Senaida Ores via telephone. Discussed FHR, uterine activity, VE, and maternal vital signs. Plan to keep pitocin at 8 mu at this time with possible IUPC place by MD in am. Unasyn and tylenol orders given for maternal temp of 100.3 orally.

## 2012-11-29 NOTE — Progress Notes (Signed)
Patient ID: [name], female   DOB: June 10, 1991, 22 y.o.   MRN: [id number] Pt is now having adequate contractions by the internal monitor. The cervix is 6 cm 80% effaced and the vertex is at -1/-2 station

## 2012-11-29 NOTE — Anesthesia Postprocedure Evaluation (Signed)
Anesthesia Post Note  Patient: Megan Skinner  Procedure(s) Performed: Procedure(s) (LRB): CESAREAN SECTION (N/A)  Anesthesia type: Epidural  Patient location: PACU  Post pain: Pain level controlled  Post assessment: Post-op Vital signs reviewed  Last Vitals:  Filed Vitals:   11/29/12 1930  BP: 125/69  Pulse: 98  Temp:   Resp: 20    Post vital signs: Reviewed  Level of consciousness: awake  Complications: No apparent anesthesia complications

## 2012-11-29 NOTE — Anesthesia Preprocedure Evaluation (Signed)
Anesthesia Evaluation  Patient identified by MRN, date of birth, ID band Patient awake    Reviewed: Allergy & Precautions, H&P , NPO status , Patient's Chart, lab work & pertinent test results  Airway Mallampati: II TM Distance: >3 FB Neck ROM: Full    Dental  (+) Teeth Intact   Pulmonary neg pulmonary ROS,          Cardiovascular negative cardio ROS  Rhythm:Regular     Neuro/Psych negative neurological ROS  negative psych ROS   GI/Hepatic negative GI ROS, Neg liver ROS,   Endo/Other  negative endocrine ROS  Renal/GU negative Renal ROS     Musculoskeletal negative musculoskeletal ROS (+)   Abdominal   Peds  Hematology negative hematology ROS (+)   Anesthesia Other Findings   Reproductive/Obstetrics (+) Pregnancy                           Anesthesia Physical Anesthesia Plan  ASA: II  Anesthesia Plan: Epidural   Post-op Pain Management:    Induction:   Airway Management Planned:   Additional Equipment:   Intra-op Plan:   Post-operative Plan:   Informed Consent: I have reviewed the patients History and Physical, chart, labs and discussed the procedure including the risks, benefits and alternatives for the proposed anesthesia with the patient or authorized representative who has indicated his/her understanding and acceptance.     Plan Discussed with:   Anesthesia Plan Comments:         Anesthesia Quick Evaluation

## 2012-11-29 NOTE — Progress Notes (Signed)
Informed Dr Arby Barrette that platelet count was 115 in PACU.  OK to discontinue Epidural.  Verified order.

## 2012-11-29 NOTE — Op Note (Signed)
Megan Skinner, PEDERSON               ACCOUNT NO.:  [id number]  MEDICAL RECORD NO.:  [id number]  LOCATION:  [facility]                          FACILITY:  [facility]  PHYSICIAN:  Malachi Pro. Ambrose Mantle, M.D. DATE OF BIRTH:  1991/06/24  DATE OF PROCEDURE:  11/29/2012 DATE OF DISCHARGE:                              OPERATIVE REPORT   PREOPERATIVE DIAGNOSIS:  Intrauterine pregnancy at term.  Prolonged rupture of membranes.  Failure to progress in labor beyond 6-7 cm.  POSTOPERATIVE DIAGNOSIS:  Intrauterine pregnancy at term.  Prolonged rupture of membranes.  Failure to progress in labor beyond 6-7 cm.  PROCEDURE:  Low-transverse cervical cesarean section.  OPERATOR:  Malachi Pro. Ambrose Mantle, M.D.  ANESTHESIA:  Epidural anesthesia.  DESCRIPTION OF PROCEDURE:  The patient was brought to the operating room, and the epidural anesthetic was effective.  She was placed on the operating table, placed in left lateral tilt position.  A Foley catheter was indwelling.  The abdomen was prepped with a DuraPrep, but because of the prep of hair was inadequate.  I asked the RN to redo the tear and then re-prepped with DuraPrep.  After a 3-minute wait and the time-out, the abdomen was draped as a sterile field.  A transverse incision was made through the skin, subcutaneous tissue, and fascia.  After anesthesia was confirmed.  The fascia was then separated from the rectus muscles superiorly and inferiorly.  The rectus muscle was somewhat separated in the midline.  I opened the peritoneum vertically, used an Alexis retractor to expose the lower uterine segment.  I made a short transverse incision through the superficial layers of the myometrium in the lower uterine segment, entered the amniotic sac with my finger, enlarged the incision by pulling superiorly and inferiorly, lifted the vertex into the incisional opening, suctioned the mouth and nose delivered the rest of the body.  The cord was clamped.  The infant was given to  the neonatologist who was in attendance, Dr. Joana Reamer.  The placenta was going to be donated for blood collection so I did not collect any blood.  I removed the placenta, as much as possible with just spontaneous removal but I did not delay much because I wanted to control the bleeding.  The lower uterine segment was extremely vascular and  several ring forceps were used to stop the hemorrhage.  The inside of the uterus was inspected and found to be free of debris.  I exteriorized the uterus momentarily to massage it very well, replaced it into the abdominal cavity.  The tubes and ovaries appeared normal.  The uterine incision was then closed with a running lock suture of 0 Vicryl on the first layer, nonlocking suture of the same material on the second layer, 3 or 4 extra figure-of-eight sutures were used superficially on the incision for complete hemostasis.  The gutters were blotted free of blood, liberal irrigation confirmed hemostasis, and the retractor was removed.  The abdominal wall was closed in layers using interrupted sutures of 0 Vicryl on the rectus muscle and peritoneum, 2 running sutures of 0 Vicryl on the fascia, running 3-0 Vicryl on the subcutaneous tissue, and staples on the skin.  The patient seemed to  tolerate the procedure well.  The infant did well.  The weight is pending and the patient was returned to recovery in satisfactory condition.     Malachi Pro. Ambrose Mantle, M.D.     TFH/MEDQ  D:  11/29/2012  T:  11/29/2012  Job:  [id number]

## 2012-11-29 NOTE — Progress Notes (Signed)
Patient ID: Megan Skinner, female   DOB: [birth year], 22 y.o.   MRN: [id number] Contractions are still adequate. There are no decelerations The cervix is 6-7 cm 80 % effaced and the vertex is at - 1 station.

## 2012-11-29 NOTE — Progress Notes (Signed)
   Subjective: Pt comfortable with epidural  Objective: BP 119/83  Pulse 89  Temp(Src) 100.9 F (38.3 C) (Oral)  Resp 20  Ht 5\' 3"  (1.6 m)  Wt 79.017 kg (174 lb 3.2 oz)  BMI 30.87 kg/m2  SpO2 99% I/O last 3 completed shifts: In: -  Out: 300 [Urine:300]    FHT:  FHR: 160 bpm, variability: minimal ,  accelerations:  Present,  decelerations:  Present mild variables UC:   regular, every 1-3 minutes SVE:  5/80/-1   Labs: Lab Results  Component Value Date   WBC 8.2 11/28/2012   HGB 11.9* 11/28/2012   HCT 37.3 11/28/2012   MCV 74.3* 11/28/2012   PLT 139* 11/28/2012    Assessment / Plan: IUPC and FSE placed  Pt had an episode of subtle late decelerations that resolved quickly with d/c of pitocin and O2.  Strip recovered and variability improved.  Temp now 100.9 so PCN changed to Unasyn and pt given tylenol. D/w pt and her mother that she has made slow progress and I suspect the baby is still OP.  We discussed that if the baby does not tolerate the pitocin when restarted or no progress made, she will need a c-section and she is agreeable to that.  We will start the pitocin and follow the FHR closely.  Megan Skinner 11/29/2012, 7:49 AM

## 2012-11-29 NOTE — Progress Notes (Signed)
c-section discussed with risks and benefits, pt verbalized understanding and gave consent, pre for OR

## 2012-11-30 ENCOUNTER — Encounter (HOSPITAL_COMMUNITY): Payer: Self-pay | Admitting: Obstetrics and Gynecology

## 2012-11-30 LAB — CCBB MATERNAL DONOR DRAW

## 2012-11-30 LAB — CBC
HCT: 31.4 % — ABNORMAL LOW (ref 36.0–46.0)
Hemoglobin: 10 g/dL — ABNORMAL LOW (ref 12.0–15.0)
MCH: 23.8 pg — ABNORMAL LOW (ref 26.0–34.0)
MCHC: 31.8 g/dL (ref 30.0–36.0)
RBC: 4.21 MIL/uL (ref 3.87–5.11)

## 2012-11-30 NOTE — Anesthesia Postprocedure Evaluation (Signed)
  Anesthesia Post-op Note  Patient: Megan Skinner  Procedure(s) Performed: Procedure(s): CESAREAN SECTION (N/A)  Patient Location: Mother/Baby  Anesthesia Type:Epidural  Level of Consciousness: awake  Airway and Oxygen Therapy: Patient Spontanous Breathing  Post-op Pain: none  Post-op Assessment: Patient's Cardiovascular Status Stable, Respiratory Function Stable, Patent Airway, No signs of Nausea or vomiting, Adequate PO intake, Pain level controlled, No headache, No backache, No residual numbness and No residual motor weakness  Post-op Vital Signs: Reviewed and stable  Complications: No apparent anesthesia complications

## 2012-11-30 NOTE — Progress Notes (Signed)
Patient ID: Megan Skinner, female   DOB: 07-Feb-1991, 22 y.o.   MRN: [id number] #1 afebrile BP normal HGB stable Will continue antibiotics for now

## 2012-12-01 NOTE — Progress Notes (Addendum)
POD #2 Doing ok Afeb, VSS Abd- soft, fundus firm, incision intact Continue routine care, will d/c Unasyn since has been afebrile

## 2012-12-02 DIAGNOSIS — Z98891 History of uterine scar from previous surgery: Secondary | ICD-10-CM

## 2012-12-02 MED ORDER — IBUPROFEN 600 MG PO TABS: 600.0000 mg | ORAL_TABLET | Freq: Four times a day (QID) | ORAL | Status: DC

## 2012-12-02 MED ORDER — OXYCODONE-ACETAMINOPHEN 5-325 MG PO TABS: 1.0000 | ORAL_TABLET | ORAL | Status: DC | PRN

## 2012-12-02 NOTE — Discharge Summary (Signed)
Obstetric Discharge Summary Reason for Admission: rupture of membranes Prenatal Procedures: none Intrapartum Procedures: cesarean: low cervical, transverse Postpartum Procedures: none Complications-Operative and Postpartum: none Hemoglobin  Date Value Range Status  11/30/2012 10.0* 12.0 - 15.0 g/dL Final     HCT  Date Value Range Status  11/30/2012 31.4* 36.0 - 46.0 % Final    Physical Exam:  General: alert Lochia: appropriate Uterine Fundus: firm Incision: healing well  Discharge Diagnoses: Term Pregnancy-delivered and arrest of dilation  Discharge Information: Date: 12/02/2012 Activity: pelvic rest and no strenuous activity Diet: routine Medications: Ibuprofen and Percocet Condition: stable Instructions: refer to practice specific booklet Discharge to: home Follow-up Information   Follow up with Bing Plume, MD. Schedule an appointment as soon as possible for a visit in 2 weeks.   Contact information:   [address], [address] [place] [address] [phone]       Newborn Data: Live born female  Birth Weight: 7 lb 10.9 oz (3485 g) APGAR: 9, 9  Home with mother.  Rachel Rison D 12/02/2012, 9:29 AM

## 2012-12-02 NOTE — Progress Notes (Signed)
POD #3 No problems Afeb, VSS Abd- soft, fundus firm, incision intact Will remove staples and d/c home

## 2012-12-02 NOTE — Anesthesia Postprocedure Evaluation (Signed)
  Anesthesia Post-op Note  Patient: Megan Skinner  Procedure(s) Performed: Procedure(s): CESAREAN SECTION (N/A)  Patient Location: Mother/Baby  Anesthesia Type:Epidural  Level of Consciousness: awake, alert  and oriented  Airway and Oxygen Therapy: Patient Spontanous Breathing  Post-op Pain: mild  Post-op Assessment: Post-op Vital signs reviewed, Respiratory Function Stable, Patent Airway, No signs of Nausea or vomiting and Pain level controlled  Post-op Vital Signs: stable  Complications: No apparent anesthesia complications

## 2013-11-11 ENCOUNTER — Encounter (HOSPITAL_COMMUNITY): Payer: Self-pay | Admitting: Emergency Medicine

## 2013-11-11 ENCOUNTER — Emergency Department (INDEPENDENT_AMBULATORY_CARE_PROVIDER_SITE_OTHER)
Admission: EM | Admit: 2013-11-11 | Discharge: 2013-11-11 | Disposition: A | Discharge status: Home / Self Care | Payer: BC Managed Care – PPO | Source: Home / Self Care | Attending: Family Medicine | Admitting: Family Medicine

## 2013-11-11 ENCOUNTER — Emergency Department (INDEPENDENT_AMBULATORY_CARE_PROVIDER_SITE_OTHER): Payer: BC Managed Care – PPO

## 2013-11-11 DIAGNOSIS — M25561 Pain in right knee: Secondary | ICD-10-CM

## 2013-11-11 DIAGNOSIS — M25569 Pain in unspecified knee: Secondary | ICD-10-CM

## 2013-11-11 IMAGING — CR DG KNEE COMPLETE 4+V*R*
4 series · 4 of 4 positions shown · non-contrast
Comparison: [DATE]

CLINICAL DATA: Knee pain, swelling.

EXAM:
RIGHT KNEE - COMPLETE 4+ VIEW

[view not recorded (1 of 4)]
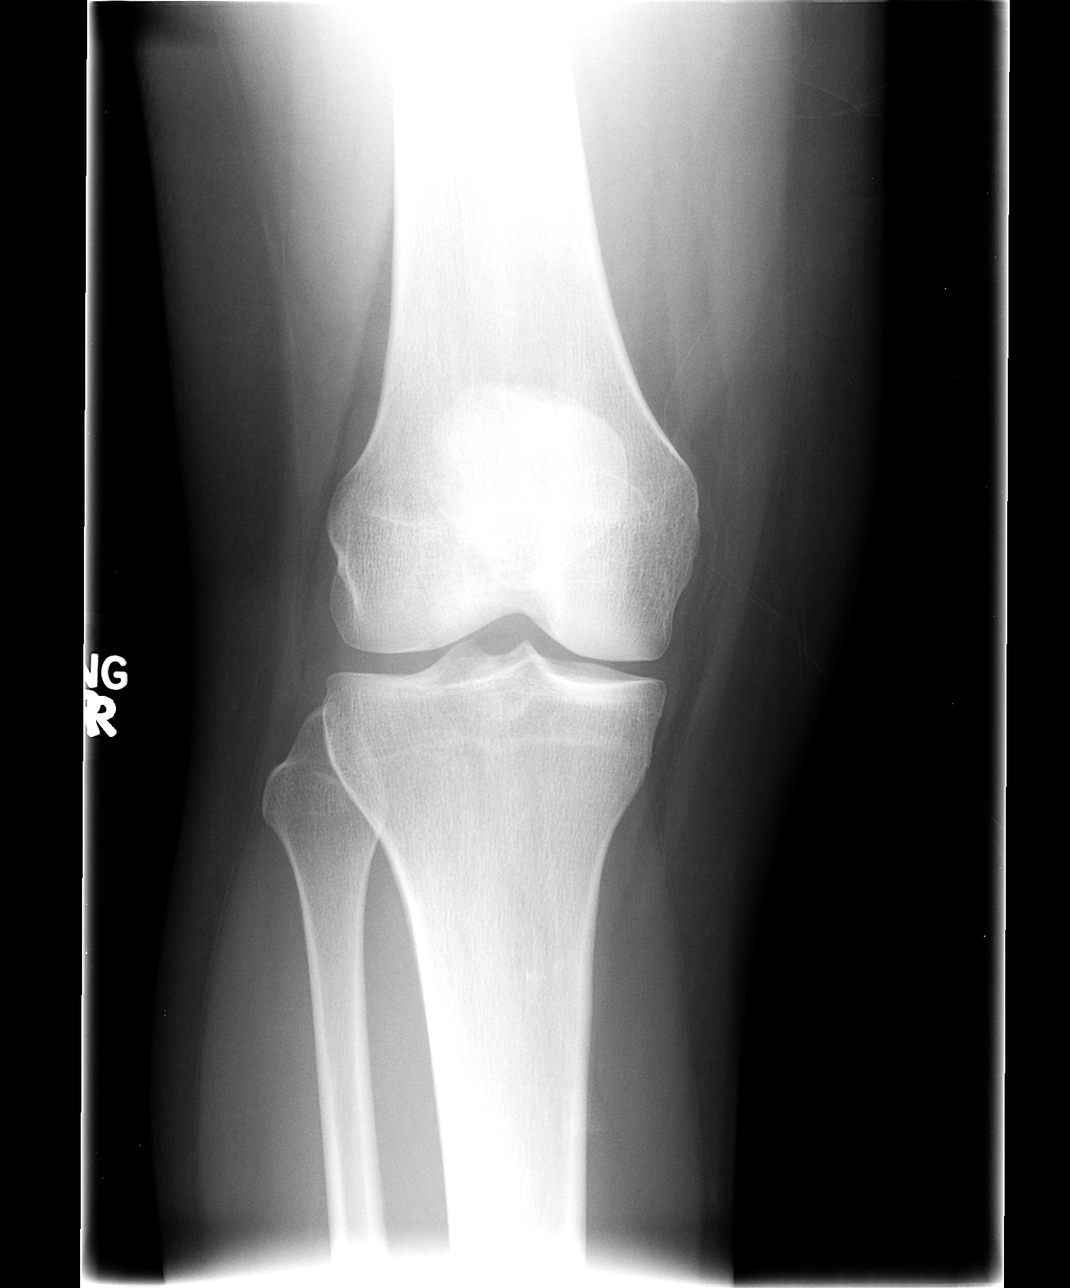

[view not recorded (2 of 4)]
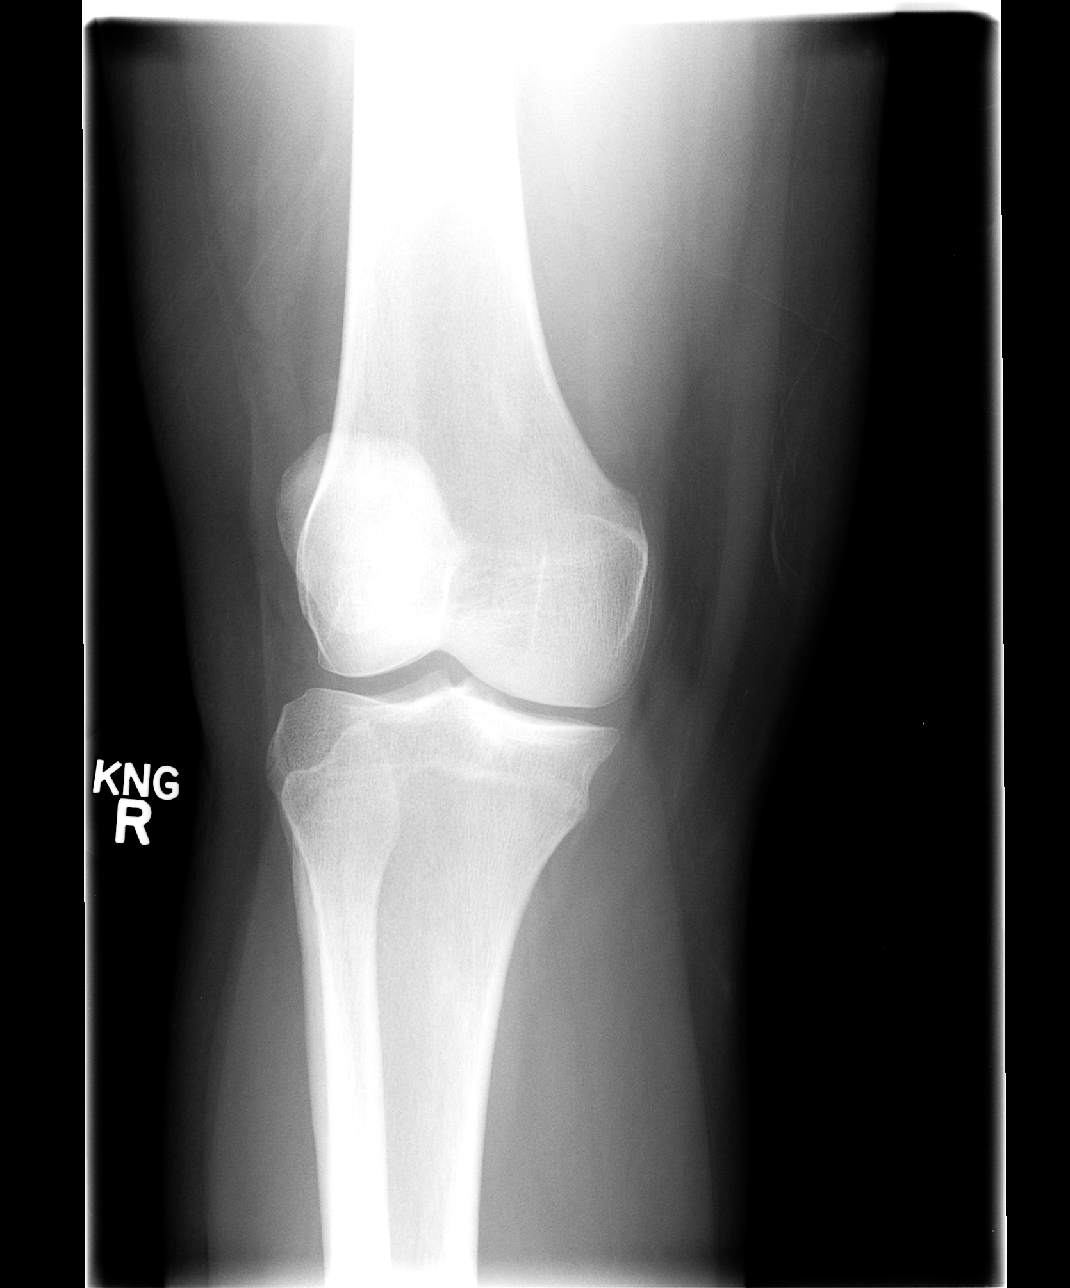

[view not recorded (3 of 4)]
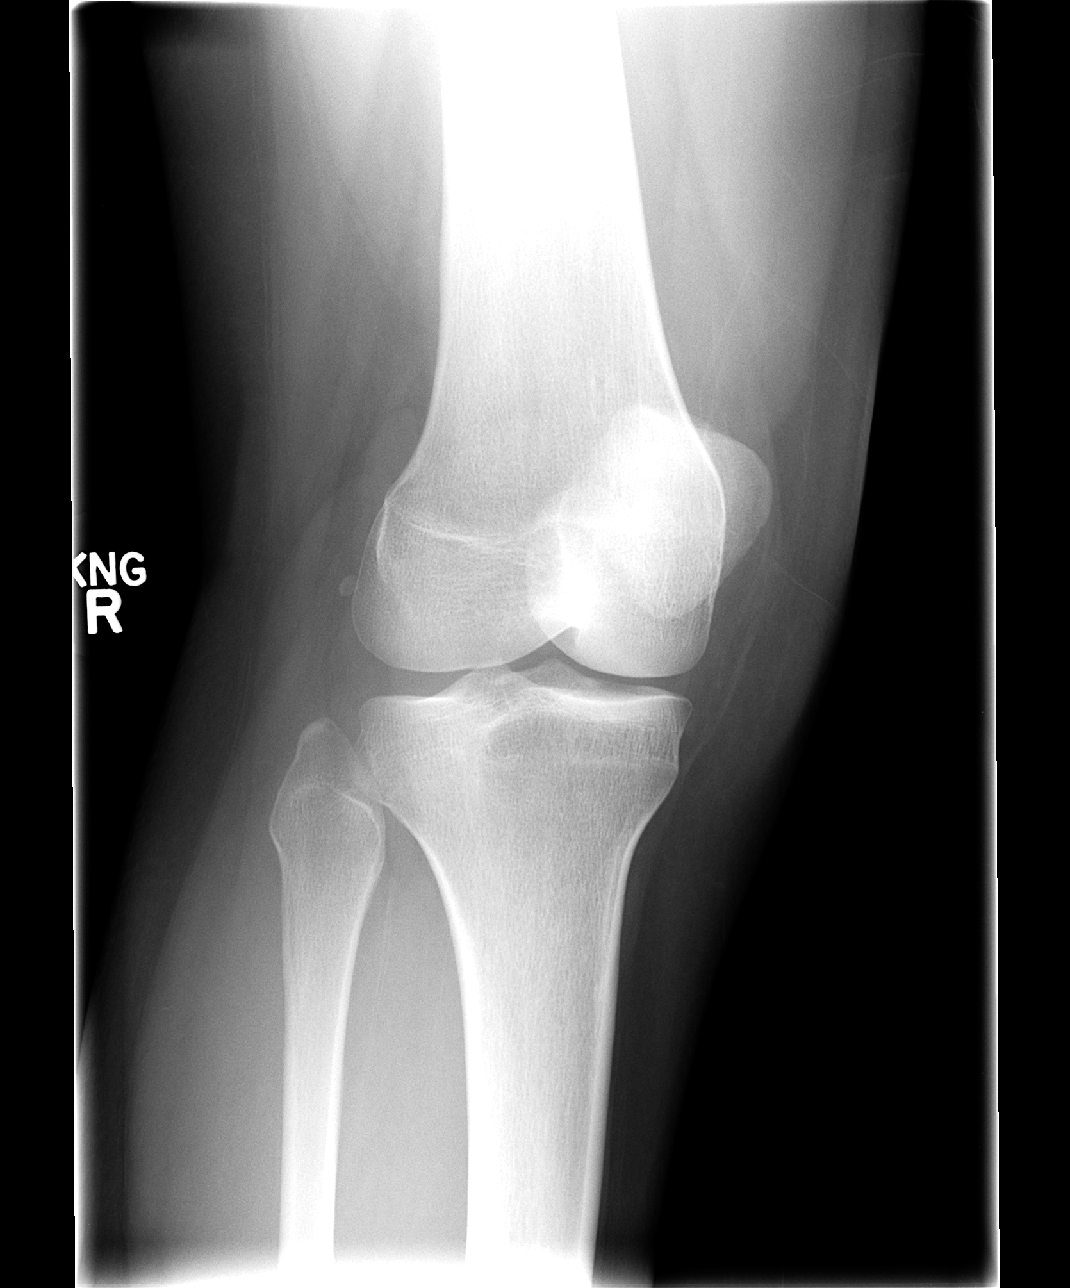

[view not recorded (4 of 4)]
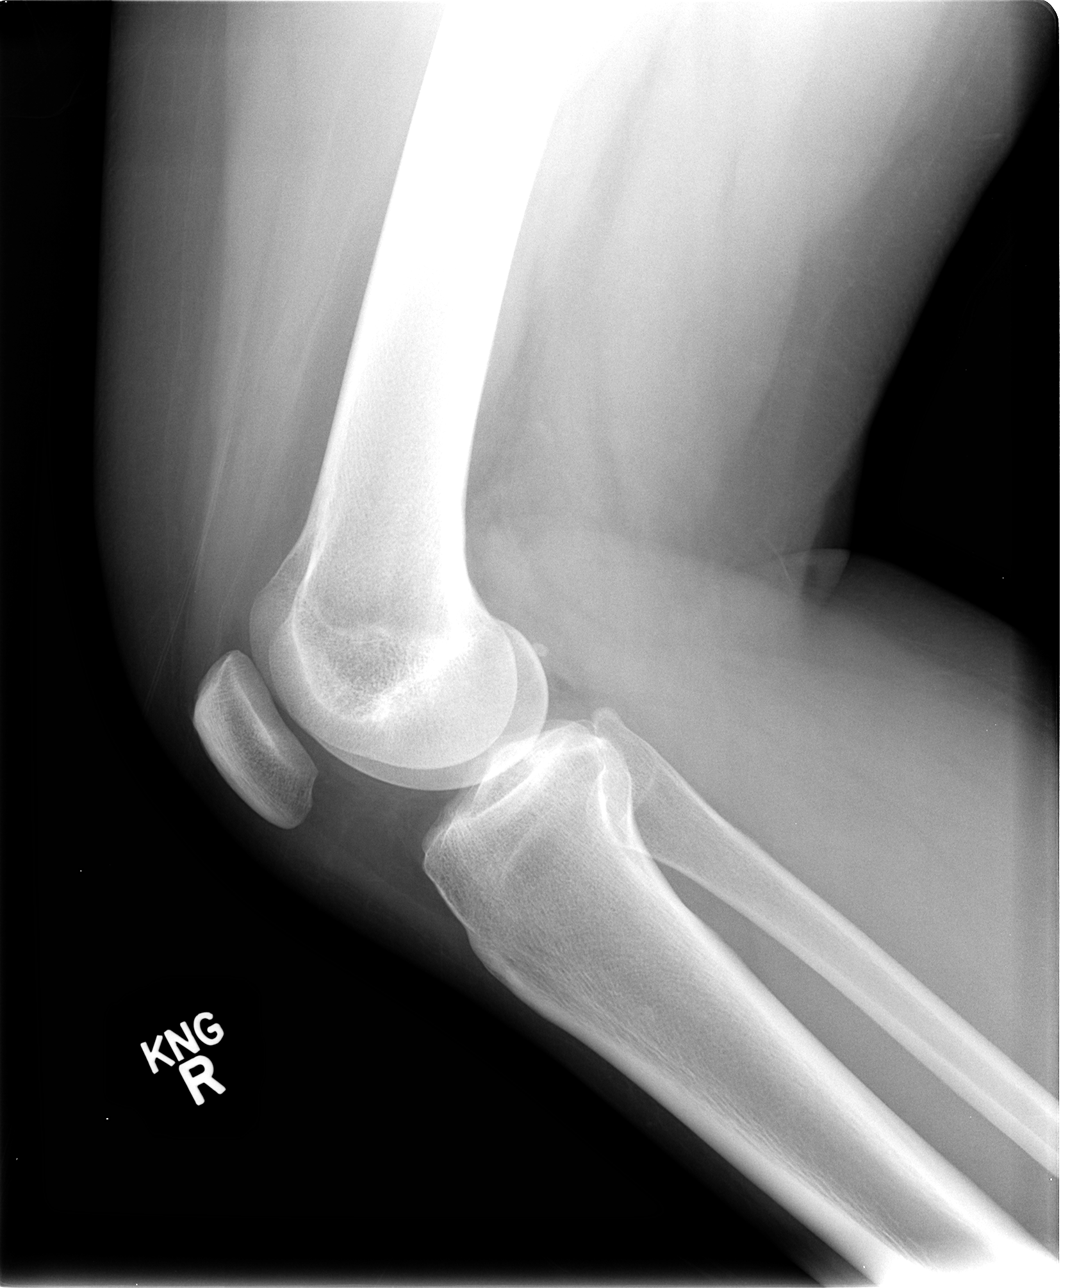

[4 of 4 positions shown; findings below may reference images not displayed]

FINDINGS: There is no evidence of fracture, dislocation, or joint effusion.
There is no evidence of arthropathy or other focal bone abnormality.
Soft tissues are unremarkable.
IMPRESSION: Negative.

## 2013-11-11 MED ORDER — NAPROXEN 375 MG PO TABS: 375.0000 mg | ORAL_TABLET | Freq: Two times a day (BID) | ORAL | Status: DC

## 2013-11-11 NOTE — ED Notes (Signed)
Pt c/o right knee pain/swelling onset 2 weeks Pain increases w/activity; denies inj/trauma Taking ibup w/no relief Alert w/no signs of acute distress.

## 2013-11-11 NOTE — Discharge Instructions (Signed)
Xrays were normal. Ice and elevate 3-4 x day and use medications as directed. If symptoms do not improve, you may follow up with either Dr. Duanne Guessewey or Dr. Carola FrostHandy.  Patellofemoral Pain Your exam shows your knee pain is probably due to a problem with the knee cap, the patella. This problem is also called patellofemoral pain, runner's knee, or chondromalacia. Most of the time, this problem is due to overuse of the knee joint. Repeated bending and straightening can irritate the underside of the knee cap. When this happens, activities such as running, walking, climbing, biking or jumping usually produce pain. Pain may also occur after prolonged sitting. Other patellofemoral symptoms can include joint stiffness, swelling, and a snapping or grinding sensation with movement. Rest and rehabilitation are usually successful in treating this problem. Surgery is rarely needed. Treatment includes correcting any mechanical factors that could hurt the normal working of the knee. This could be weak thigh muscles or foot problems. Avoid repetitive activities of the knee until the pain and other symptoms improve. Apply ice packs over the knee for 20 to 30 minutes every 2 to 4 hours to reduce pain and swelling. Only take over-the-counter or prescription medicines for pain, discomfort, or fever as directed by your caregiver. Knee braces or neoprene sleeves may help reduce irritation. Rehabilitation exercises to strengthen the quad muscle are often prescribed when your symptoms are better. Call your caregiver for a follow-up exam to evaluate your response to treatment. Document Released: 07/28/2004 Document Revised: 09/12/2011 Document Reviewed: 06/20/2005 [facility] Patient Information 2014 [employer] [place].

## 2013-11-11 NOTE — ED Provider Notes (Signed)
CSN: [id number]     Arrival date & time 11/11/13  1227 History   First MD Initiated Contact with Patient 11/11/13 1448     Chief Complaint  Patient presents with  . Knee Pain   (Consider location/radiation/quality/duration/timing/severity/associated sxs/prior Treatment) Patient is a 23 y.o. female presenting with knee pain. The history is provided by the patient.  Knee Pain Location:  Knee Time since incident:  2 weeks Injury: no   Knee location:  R knee Pain details:    Quality:  Pressure   Radiates to:  Does not radiate   Severity:  Mild   Onset quality:  Gradual   Duration:  2 weeks   Progression:  Waxing and waning Chronicity:  New Dislocation: no   Foreign body present:  No foreign bodies Prior injury to area:  No Associated symptoms: swelling     Past Medical History  Diagnosis Date  . Medical history non-contributory    Past Surgical History  Procedure Laterality Date  . Abdominal hernia repair    . Cesarean section N/A 11/29/2012    Procedure: CESAREAN SECTION;  Surgeon: Bing Plumehomas F Henley, MD;  Location: [facility];  Service: Obstetrics;  Laterality: N/A;   Family History  Problem Relation Age of Onset  . Diabetes Father   . Diabetes Maternal Grandmother   . Hyperlipidemia Maternal Grandmother   . Cancer Maternal Grandmother     peritoneal, ovarian  . Hypertension Maternal Grandmother   . Diabetes Maternal Grandfather   . Hyperlipidemia Maternal Grandfather   . Stroke Maternal Grandfather   . Heart disease Maternal Grandfather   . Heart attack Maternal Grandfather   . Heart disease Paternal Grandmother   . Hypertension Paternal Grandmother   . Heart attack Paternal Grandmother   . Hearing loss Neg Hx   . Arthritis Paternal Grandfather   . Diabetes Paternal Grandfather   . Anemia Mother   . Hypertension Mother   . Depression Maternal Aunt    History  Substance Use Topics  . Smoking status: Never Smoker   . Smokeless tobacco: Never Used  . Alcohol Use:  No   OB History   Grav Para Term Preterm Abortions TAB SAB Ect Mult Living   1 1 1       1      Review of Systems  All other systems reviewed and are negative.   Allergies  Review of patient's allergies indicates no known allergies.  Home Medications   Prior to Admission medications   Medication Sig Start Date End Date Taking? Authorizing Provider  ibuprofen (ADVIL,MOTRIN) 600 MG tablet Take 1 tablet (600 mg total) by mouth every 6 (six) hours. 12/02/12   Lavina Hammanodd Meisinger, MD  oxyCODONE-acetaminophen (PERCOCET/ROXICET) 5-325 MG per tablet Take 1-2 tablets by mouth every 4 (four) hours as needed. 12/02/12   Lavina Hammanodd Meisinger, MD  Prenatal Vit-Fe Fumarate-FA (PRENATAL MULTIVITAMIN) TABS Take 1 tablet by mouth daily.     Historical Provider, MD   BP 117/73  Pulse 74  Temp(Src) 98.1 F (36.7 C) (Oral)  Resp 14  SpO2 100%  LMP 10/21/2013  Breastfeeding? No Physical Exam  Nursing note and vitals reviewed. Constitutional: She is oriented to person, place, and time. She appears well-developed and well-nourished. No distress.  HENT:  Head: Normocephalic and atraumatic.  Eyes: Conjunctivae are normal.  Cardiovascular: Normal rate, regular rhythm and normal heart sounds.   Pulmonary/Chest: Effort normal and breath sounds normal.  Musculoskeletal: Normal range of motion.       Right knee:  She exhibits swelling and effusion. She exhibits normal range of motion, no ecchymosis, no deformity, no laceration, no erythema, normal alignment, no LCL laxity, normal patellar mobility, no bony tenderness and no MCL laxity. Tenderness found. No medial joint line, no lateral joint line and no patellar tendon tenderness noted.       Legs: Neurological: She is alert and oriented to person, place, and time.  Skin: Skin is warm and dry. No rash noted. No erythema.  Psychiatric: She has a normal mood and affect. Her behavior is normal.    ED Course  Procedures (including critical care time) Labs Review Labs  Reviewed - No data to display  Imaging Review Dg Knee Complete 4 Views Right  11/11/2013   CLINICAL DATA:  Knee pain, swelling.  EXAM: RIGHT KNEE - COMPLETE 4+ VIEW  COMPARISON:  08/05/2008  FINDINGS: There is no evidence of fracture, dislocation, or joint effusion. There is no evidence of arthropathy or other focal bone abnormality. Soft tissues are unremarkable.  IMPRESSION: Negative.   Electronically Signed   By: Charlett NoseKevin  Dover M.D.   On: 11/11/2013 15:11     MDM   1. Right anterior knee pain   Xrays normal. RICE and NSAIDs as prescribed with PCP or ortho follow up if no improvement.     Jess BartersJennifer Lee DecaturPresson, GeorgiaPA 11/11/13 [phone]

## 2013-11-14 NOTE — ED Provider Notes (Signed)
Medical screening examination/treatment/procedure(s) were performed by a resident physician or non-physician practitioner and as the supervising physician I was immediately available for consultation/collaboration.  Jashira Cotugno, MD    Arletta Lumadue S Jakerria Kingbird, MD 11/14/13 0815 

## 2014-05-05 ENCOUNTER — Encounter (HOSPITAL_COMMUNITY): Payer: Self-pay | Admitting: Emergency Medicine

## 2014-08-19 ENCOUNTER — Emergency Department (HOSPITAL_COMMUNITY)
Admission: EM | Admit: 2014-08-19 | Discharge: 2014-08-20 | Disposition: A | Discharge status: Home / Self Care | Payer: Managed Care, Other (non HMO) | Attending: Emergency Medicine | Admitting: Emergency Medicine

## 2014-08-19 ENCOUNTER — Encounter (HOSPITAL_COMMUNITY): Payer: Self-pay | Admitting: Adult Health

## 2014-08-19 DIAGNOSIS — Z3202 Encounter for pregnancy test, result negative: Secondary | ICD-10-CM | POA: Insufficient documentation

## 2014-08-19 DIAGNOSIS — K529 Noninfective gastroenteritis and colitis, unspecified: Secondary | ICD-10-CM | POA: Diagnosis not present

## 2014-08-19 DIAGNOSIS — R509 Fever, unspecified: Secondary | ICD-10-CM | POA: Diagnosis present

## 2014-08-19 LAB — CBC WITH DIFFERENTIAL/PLATELET
BASOS PCT: 0 % (ref 0–1)
Basophils Absolute: 0 10*3/uL (ref 0.0–0.1)
EOS PCT: 0 % (ref 0–5)
Eosinophils Absolute: 0 10*3/uL (ref 0.0–0.7)
HEMATOCRIT: 41.9 % (ref 36.0–46.0)
HEMOGLOBIN: 13.4 g/dL (ref 12.0–15.0)
LYMPHS ABS: 1.1 10*3/uL (ref 0.7–4.0)
Lymphocytes Relative: 21 % (ref 12–46)
MCH: 23.2 pg — AB (ref 26.0–34.0)
MCHC: 32 g/dL (ref 30.0–36.0)
MCV: 72.6 fL — AB (ref 78.0–100.0)
MONOS PCT: 7 % (ref 3–12)
Monocytes Absolute: 0.4 10*3/uL (ref 0.1–1.0)
NEUTROS ABS: 3.5 10*3/uL (ref 1.7–7.7)
Neutrophils Relative %: 72 % (ref 43–77)
Platelets: 207 10*3/uL (ref 150–400)
RBC: 5.77 MIL/uL — ABNORMAL HIGH (ref 3.87–5.11)
RDW: 14.1 % (ref 11.5–15.5)
WBC: 5 10*3/uL (ref 4.0–10.5)

## 2014-08-19 LAB — POC URINE PREG, ED: Preg Test, Ur: NEGATIVE

## 2014-08-19 LAB — URINE MICROSCOPIC-ADD ON

## 2014-08-19 LAB — COMPREHENSIVE METABOLIC PANEL
ALT: 14 U/L (ref 0–35)
AST: 22 U/L (ref 0–37)
Albumin: 4.1 g/dL (ref 3.5–5.2)
Alkaline Phosphatase: 46 U/L (ref 39–117)
Anion gap: 7 (ref 5–15)
BUN: 11 mg/dL (ref 6–23)
CALCIUM: 8.6 mg/dL (ref 8.4–10.5)
CO2: 24 mmol/L (ref 19–32)
CREATININE: 0.89 mg/dL (ref 0.50–1.10)
Chloride: 105 mmol/L (ref 96–112)
GFR calc Af Amer: >90 mL/min (ref >90)
GFR, EST NON AFRICAN AMERICAN: 89 mL/min — AB (ref >90)
Glucose, Bld: 112 mg/dL — ABNORMAL HIGH (ref 70–99)
Potassium: 3.6 mmol/L (ref 3.5–5.1)
SODIUM: 136 mmol/L (ref 135–145)
TOTAL PROTEIN: 7.3 g/dL (ref 6.0–8.3)
Total Bilirubin: 1.3 mg/dL — ABNORMAL HIGH (ref 0.3–1.2)

## 2014-08-19 LAB — URINALYSIS, ROUTINE W REFLEX MICROSCOPIC
BILIRUBIN URINE: NEGATIVE
Glucose, UA: NEGATIVE mg/dL
Ketones, ur: 15 mg/dL — AB
Leukocytes, UA: NEGATIVE
Nitrite: NEGATIVE
PH: 5.5 (ref 5.0–8.0)
Protein, ur: NEGATIVE mg/dL
SPECIFIC GRAVITY, URINE: 1.03 (ref 1.005–1.030)
UROBILINOGEN UA: 0.2 mg/dL (ref 0.0–1.0)

## 2014-08-19 LAB — I-STAT CG4 LACTIC ACID, ED: Lactic Acid, Venous: 1.39 mmol/L (ref 0.5–2.0)

## 2014-08-19 MED ORDER — SODIUM CHLORIDE 0.9 % IV BOLUS (SEPSIS)
1000.0000 mL | Freq: Once | INTRAVENOUS | Status: AC
  Administered 2014-08-19: 1000 mL via INTRAVENOUS

## 2014-08-19 MED ORDER — KETOROLAC TROMETHAMINE 30 MG/ML IJ SOLN
30.0000 mg | Freq: Once | INTRAMUSCULAR | Status: AC
  Administered 2014-08-19: 30 mg via INTRAVENOUS
  Filled 2014-08-19: qty 1

## 2014-08-19 MED ORDER — ACETAMINOPHEN 325 MG PO TABS
650.0000 mg | ORAL_TABLET | Freq: Four times a day (QID) | ORAL | Status: DC | PRN
  Administered 2014-08-19: 650 mg via ORAL
  Filled 2014-08-19: qty 2

## 2014-08-19 MED ORDER — SODIUM CHLORIDE 0.9 % IV SOLN
1000.0000 mL | Freq: Once | INTRAVENOUS | Status: AC
  Administered 2014-08-19: 1000 mL via INTRAVENOUS

## 2014-08-19 MED ORDER — ONDANSETRON HCL 4 MG/2ML IJ SOLN
4.0000 mg | Freq: Once | INTRAMUSCULAR | Status: DC
  Filled 2014-08-19: qty 2

## 2014-08-19 MED ORDER — ONDANSETRON HCL 4 MG/2ML IJ SOLN
4.0000 mg | Freq: Once | INTRAMUSCULAR | Status: AC
  Administered 2014-08-19: 4 mg via INTRAVENOUS
  Filled 2014-08-19: qty 2

## 2014-08-19 NOTE — ED Notes (Signed)
Called no answer x1

## 2014-08-19 NOTE — ED Notes (Signed)
Presents with onset of nausea, vomiting and diarrhea began at 5 am today-7 bouts of diarrhea and 2 x emesis. Fever of 100.9 and back pain.  Unable to keep fluids or food down.

## 2014-08-19 NOTE — ED Provider Notes (Signed)
CSN: [id number]     Arrival date & time 08/19/14  2011 History   First MD Initiated Contact with Patient 08/19/14 2215     Chief Complaint  Patient presents with  . Fever  . Abdominal Pain  . Diarrhea     (Consider location/radiation/quality/duration/timing/severity/associated sxs/prior Treatment) HPI Comments: Patient is an otherwise healthy 24 year old female who presents with complaints of nausea, vomiting, diarrhea that started earlier this morning. All has been nonbloody and nonbilious. She denies any ill contacts. She reports generalized abdominal cramping however no focal pain. She denies any urinary complaints.  Patient is a 24 y.o. female presenting with diarrhea. The history is provided by the patient.  Diarrhea Quality:  Watery Severity:  Moderate Onset quality:  Sudden Duration:  12 hours Timing:  Constant Progression:  Unchanged Relieved by:  Nothing Worsened by:  Nothing tried Ineffective treatments:  None tried Associated symptoms: abdominal pain, chills and vomiting   Associated symptoms: no fever     Past Medical History  Diagnosis Date  . Medical history non-contributory    Past Surgical History  Procedure Laterality Date  . Abdominal hernia repair    . Cesarean section N/A 11/29/2012    Procedure: CESAREAN SECTION;  Surgeon: Bing Plume, MD;  Location: [facility];  Service: Obstetrics;  Laterality: N/A;   Family History  Problem Relation Age of Onset  . Diabetes Father   . Diabetes Maternal Grandmother   . Hyperlipidemia Maternal Grandmother   . Cancer Maternal Grandmother     peritoneal, ovarian  . Hypertension Maternal Grandmother   . Diabetes Maternal Grandfather   . Hyperlipidemia Maternal Grandfather   . Stroke Maternal Grandfather   . Heart disease Maternal Grandfather   . Heart attack Maternal Grandfather   . Heart disease Paternal Grandmother   . Hypertension Paternal Grandmother   . Heart attack Paternal Grandmother   . Hearing loss  Neg Hx   . Arthritis Paternal Grandfather   . Diabetes Paternal Grandfather   . Anemia Mother   . Hypertension Mother   . Depression Maternal Aunt    History  Substance Use Topics  . Smoking status: Never Smoker   . Smokeless tobacco: Never Used  . Alcohol Use: No   OB History    Gravida Para Term Preterm AB TAB SAB Ectopic Multiple Living   Review of Systems  Constitutional: Positive for chills. Negative for fever.  Gastrointestinal: Positive for vomiting, abdominal pain and diarrhea.  All other systems reviewed and are negative.     Allergies  Review of patient's allergies indicates no known allergies.  Home Medications   Prior to Admission medications   Medication Sig Start Date End Date Taking? Authorizing Provider  levonorgestrel (MIRENA) 20 MCG/24HR IUD 1 each by Intrauterine route once. January 10, 2013   Yes Historical Provider, MD  ibuprofen (ADVIL,MOTRIN) 600 MG tablet Take 1 tablet (600 mg total) by mouth every 6 (six) hours. Patient not taking: Reported on 08/19/2014 12/02/12   Lavina Hamman, MD  naproxen (NAPROSYN) 375 MG tablet Take 1 tablet (375 mg total) by mouth 2 (two) times daily with a meal. X 7 days for right knee pain Patient not taking: Reported on 08/19/2014 11/11/13   Ria Clock, PA  oxyCODONE-acetaminophen (PERCOCET/ROXICET) 5-325 MG per tablet Take 1-2 tablets by mouth every 4 (four) hours as needed. Patient not taking: Reported on 08/19/2014 12/02/12   Lavina Hamman, MD  BP 120/70 mmHg  Pulse 104  Temp(Src) 100.5 F (38.1 C) (Oral)  Resp 20  SpO2 100%  LMP 08/15/2014 (Exact Date) Physical Exam  Constitutional: She is oriented to person, place, and time. She appears well-developed and well-nourished. No distress.  HENT:  Head: Normocephalic and atraumatic.  Mouth/Throat: Oropharynx is clear and moist.  Neck: Normal range of motion. Neck supple.  Cardiovascular: Normal rate and regular rhythm.  Exam reveals no  gallop and no friction rub.   No murmur heard. Pulmonary/Chest: Effort normal and breath sounds normal. No respiratory distress. She has no wheezes.  Abdominal: Soft. Bowel sounds are normal. She exhibits no distension. There is no tenderness.  Musculoskeletal: Normal range of motion.  Neurological: She is alert and oriented to person, place, and time.  Skin: Skin is warm and dry. She is not diaphoretic. No pallor.  Nursing note and vitals reviewed.   ED Course  Procedures (including critical care time) Labs Review Labs Reviewed  COMPREHENSIVE METABOLIC PANEL - Abnormal; Notable for the following:    Glucose, Bld 112 (*)    Total Bilirubin 1.3 (*)    GFR calc non Af Amer 89 (*)    All other components within normal limits  CBC WITH DIFFERENTIAL/PLATELET - Abnormal; Notable for the following:    RBC 5.77 (*)    MCV 72.6 (*)    MCH 23.2 (*)    All other components within normal limits  URINALYSIS, ROUTINE W REFLEX MICROSCOPIC - Abnormal; Notable for the following:    Color, Urine AMBER (*)    Hgb urine dipstick TRACE (*)    Ketones, ur 15 (*)    All other components within normal limits  URINE MICROSCOPIC-ADD ON - Abnormal; Notable for the following:    Squamous Epithelial / LPF FEW (*)    All other components within normal limits  I-STAT CG4 LACTIC ACID, ED  POC URINE PREG, ED    Imaging Review No results found.   EKG Interpretation None      MDM   Final diagnoses:  None    Patient is a 24 year old female who presents with complaints of nausea, vomiting, diarrhea since early this morning. She's had low-grade fevers. Her abdominal exam is benign and I suspect a viral etiology. She was hydrated with normal saline and given Toradol and anti-medics. She is feeling better now resting comfortably. I feel as though she is appropriate for discharge. She will be given Zofran to use as needed for nausea returns.    Geoffery Lyonsouglas Ethridge Sollenberger, MD 08/20/14 0001

## 2014-08-20 MED ORDER — ONDANSETRON 8 MG PO TBDP: ORAL_TABLET | ORAL | Status: DC

## 2014-08-20 NOTE — Discharge Instructions (Signed)
Zofran as prescribed as needed for nausea.  Clear liquid diet for the next 12 hours, then slowly advance to normal.  Return to the emergency department if you develop severe abdominal pain, bloody stool, or other new and concerning symptoms.   Viral Gastroenteritis Viral gastroenteritis is also known as stomach flu. This condition affects the stomach and intestinal tract. It can cause sudden diarrhea and vomiting. The illness typically lasts 3 to 8 days. Most people develop an immune response that eventually gets rid of the virus. While this natural response develops, the virus can make you quite ill. CAUSES  Many different viruses can cause gastroenteritis, such as rotavirus or noroviruses. You can catch one of these viruses by consuming contaminated food or water. You may also catch a virus by sharing utensils or other personal items with an infected person or by touching a contaminated surface. SYMPTOMS  The most common symptoms are diarrhea and vomiting. These problems can cause a severe loss of body fluids (dehydration) and a body salt (electrolyte) imbalance. Other symptoms may include:  Fever.  Headache.  Fatigue.  Abdominal pain. DIAGNOSIS  Your caregiver can usually diagnose viral gastroenteritis based on your symptoms and a physical exam. A stool sample may also be taken to test for the presence of viruses or other infections. TREATMENT  This illness typically goes away on its own. Treatments are aimed at rehydration. The most serious cases of viral gastroenteritis involve vomiting so severely that you are not able to keep fluids down. In these cases, fluids must be given through an intravenous line (IV). HOME CARE INSTRUCTIONS   Drink enough fluids to keep your urine clear or pale yellow. Drink small amounts of fluids frequently and increase the amounts as tolerated.  Ask your caregiver for specific rehydration instructions.  Avoid:  Foods high in  sugar.  Alcohol.  Carbonated drinks.  Tobacco.  Juice.  Caffeine drinks.  Extremely hot or cold fluids.  Fatty, greasy foods.  Too much intake of anything at one time.  Dairy products until 24 to 48 hours after diarrhea stops.  You may consume probiotics. Probiotics are active cultures of beneficial bacteria. They may lessen the amount and number of diarrheal stools in adults. Probiotics can be found in yogurt with active cultures and in supplements.  Wash your hands well to avoid spreading the virus.  Only take over-the-counter or prescription medicines for pain, discomfort, or fever as directed by your caregiver. Do not give aspirin to children. Antidiarrheal medicines are not recommended.  Ask your caregiver if you should continue to take your regular prescribed and over-the-counter medicines.  Keep all follow-up appointments as directed by your caregiver. SEEK IMMEDIATE MEDICAL CARE IF:   You are unable to keep fluids down.  You do not urinate at least once every 6 to 8 hours.  You develop shortness of breath.  You notice blood in your stool or vomit. This may look like coffee grounds.  You have abdominal pain that increases or is concentrated in one small area (localized).  You have persistent vomiting or diarrhea.  You have a fever.  The patient is a child younger than 3 months, and he or she has a fever.  The patient is a child older than 3 months, and he or she has a fever and persistent symptoms.  The patient is a child older than 3 months, and he or she has a fever and symptoms suddenly get worse.  The patient is a baby, and he or  she has no tears when crying. MAKE SURE YOU:   Understand these instructions.  Will watch your condition.  Will get help right away if you are not doing well or get worse. Document Released: 06/20/2005 Document Revised: 09/12/2011 Document Reviewed: 04/06/2011 [facility] Patient Information 2015 [place], [place]. This  information is not intended to replace advice given to you by your health care provider. Make sure you discuss any questions you have with your health care provider.

## 2014-10-04 ENCOUNTER — Emergency Department (HOSPITAL_COMMUNITY)
Admission: EM | Admit: 2014-10-04 | Discharge: 2014-10-04 | Disposition: A | Discharge status: Home / Self Care | Payer: Managed Care, Other (non HMO) | Source: Home / Self Care | Attending: Family Medicine | Admitting: Family Medicine

## 2014-10-04 ENCOUNTER — Encounter (HOSPITAL_COMMUNITY): Payer: Self-pay | Admitting: *Deleted

## 2014-10-04 DIAGNOSIS — R0789 Other chest pain: Secondary | ICD-10-CM

## 2014-10-04 DIAGNOSIS — R071 Chest pain on breathing: Principal | ICD-10-CM

## 2014-10-04 MED ORDER — DICLOFENAC SODIUM 75 MG PO TBEC: 75.0000 mg | DELAYED_RELEASE_TABLET | Freq: Two times a day (BID) | ORAL | Status: DC

## 2014-10-04 MED ORDER — KETOROLAC TROMETHAMINE 60 MG/2ML IM SOLN
INTRAMUSCULAR | Status: AC
  Filled 2014-10-04: qty 2

## 2014-10-04 MED ORDER — KETOROLAC TROMETHAMINE 60 MG/2ML IM SOLN
60.0000 mg | Freq: Once | INTRAMUSCULAR | Status: AC
  Administered 2014-10-04: 60 mg via INTRAMUSCULAR

## 2014-10-04 NOTE — Discharge Instructions (Signed)
Chest Wall Pain °Chest wall pain is pain felt in or around the chest bones and muscles. It may take up to 6 weeks to get better. It may take longer if you are active. Chest wall pain can happen on its own. Other times, things like germs, injury, coughing, or exercise can cause the pain. °HOME CARE  °· Avoid activities that make you tired or cause pain. Try not to use your chest, belly (abdominal), or side muscles. Do not use heavy weights. °· Put ice on the sore area. °· Put ice in a plastic bag. °· Place a towel between your skin and the bag. °· Leave the ice on for 15-20 minutes for the first 2 days. °· Only take medicine as told by your doctor. °GET HELP RIGHT AWAY IF:  °· You have more pain or are very uncomfortable. °· You have a fever. °· Your chest pain gets worse. °· You have new problems. °· You feel sick to your stomach (nauseous) or throw up (vomit). °· You start to sweat or feel lightheaded. °· You have a cough with mucus (phlegm). °· You cough up blood. °MAKE SURE YOU:  °· Understand these instructions. °· Will watch your condition. °· Will get help right away if you are not doing well or get worse. °Document Released: 12/07/2007 Document Revised: 09/12/2011 Document Reviewed: 02/14/2011 °ExitCare® Patient Information ©2015 [employer]. This information is not intended to replace advice given to you by your health care provider. Make sure you discuss any questions you have with your health care provider. ° °Costochondritis °Costochondritis, sometimes called Tietze syndrome, is a swelling and irritation (inflammation) of the tissue (cartilage) that connects your ribs with your breastbone (sternum). It causes pain in the chest and rib area. Costochondritis usually goes away on its own over time. It can take up to 6 weeks or longer to get better, especially if you are unable to limit your activities. °CAUSES  °Some cases of costochondritis have no known cause. Possible causes include: °· Injury  (trauma). °· Exercise or activity such as lifting. °· Severe coughing. °SIGNS AND SYMPTOMS °· Pain and tenderness in the chest and rib area. °· Pain that gets worse when coughing or taking deep breaths. °· Pain that gets worse with specific movements. °DIAGNOSIS  °Your health care provider will do a physical exam and ask about your symptoms. Chest X-rays or other tests may be done to rule out other problems. °TREATMENT  °Costochondritis usually goes away on its own over time. Your health care provider may prescribe medicine to help relieve pain. °HOME CARE INSTRUCTIONS  °· Avoid exhausting physical activity. Try not to strain your ribs during normal activity. This would include any activities using chest, abdominal, and side muscles, especially if heavy weights are used. °· Apply ice to the affected area for the first 2 days after the pain begins. °¨ Put ice in a plastic bag. °¨ Place a towel between your skin and the bag. °¨ Leave the ice on for 20 minutes, 2-3 times a day. °· Only take over-the-counter or prescription medicines as directed by your health care provider. °SEEK MEDICAL CARE IF: °· You have redness or swelling at the rib joints. These are signs of infection. °· Your pain does not go away despite rest or medicine. °SEEK IMMEDIATE MEDICAL CARE IF:  °· Your pain increases or you are very uncomfortable. °· You have shortness of breath or difficulty breathing. °· You cough up blood. °· You have worse chest pains,   sweating, or vomiting.  You have a fever or persistent symptoms for more than 2-3 days.  You have a fever and your symptoms suddenly get worse. MAKE SURE YOU:   Understand these instructions.  Will watch your condition.  Will get help right away if you are not doing well or get worse. Document Released: 03/30/2005 Document Revised: 04/10/2013 Document Reviewed: 01/22/2013 [facility] Patient Information 2015 [place]. This information is not intended to replace advice given to you  by your health care provider. Make sure you discuss any questions you have with your health care provider.    Should you worsen please go the ER for further evaluation. I think this is probably muscular inflammation. Take the medication until complete for treatment

## 2014-10-04 NOTE — ED Provider Notes (Signed)
CSN: [id number]     Arrival date & time 10/04/14  1856 History   First MD Initiated Contact with Patient 10/04/14 1911     Chief Complaint  Patient presents with  . Chest Pain   (Consider location/radiation/quality/duration/timing/severity/associated sxs/prior Treatment) HPI Comments: Patient presents with a 24 hour history of left anterior chest radiating to left shoulder and upper back pain. No prior history. Pain with deep breath. Pain with ROM and to palpate. No fever or chills. No cough or congestion.   Patient is a 24 y.o. female presenting with chest pain. The history is provided by the patient.  Chest Pain Associated symptoms: no cough and no shortness of breath     Past Medical History  Diagnosis Date  . Medical history non-contributory    Past Surgical History  Procedure Laterality Date  . Cesarean section N/A 11/29/2012    Procedure: CESAREAN SECTION;  Surgeon: Bing Plumehomas F Henley, MD;  Location: [facility];  Service: Obstetrics;  Laterality: N/A;  . Abdominal hernia repair  2001   Family History  Problem Relation Age of Onset  . Diabetes Father   . Diabetes Maternal Grandmother   . Hyperlipidemia Maternal Grandmother   . Cancer Maternal Grandmother     peritoneal, ovarian  . Hypertension Maternal Grandmother   . Diabetes Maternal Grandfather   . Hyperlipidemia Maternal Grandfather   . Stroke Maternal Grandfather   . Heart disease Maternal Grandfather   . Heart attack Maternal Grandfather   . Heart disease Paternal Grandmother   . Hypertension Paternal Grandmother   . Heart attack Paternal Grandmother   . Hearing loss Neg Hx   . Arthritis Paternal Grandfather   . Diabetes Paternal Grandfather   . Anemia Mother   . Hypertension Mother   . Depression Maternal Aunt    History  Substance Use Topics  . Smoking status: Never Smoker   . Smokeless tobacco: Never Used  . Alcohol Use: No   OB History    Gravida Para Term Preterm AB TAB SAB Ectopic Multiple Living   1 1  1       1      Review of Systems  HENT: Negative.   Respiratory: Negative for cough, shortness of breath and wheezing.   Cardiovascular: Positive for chest pain.  Allergic/Immunologic: Positive for environmental allergies.  Psychiatric/Behavioral: Negative.   All other systems reviewed and are negative.   Allergies  Review of patient's allergies indicates no known allergies.  Home Medications   Prior to Admission medications   Medication Sig Start Date End Date Taking? Authorizing Provider  levonorgestrel (MIRENA) 20 MCG/24HR IUD 1 each by Intrauterine route once. January 10, 2013   Yes Historical Provider, MD  diclofenac (VOLTAREN) 75 MG EC tablet Take 1 tablet (75 mg total) by mouth 2 (two) times daily. 10/04/14   Riki SheerMichelle G Young, PA-C  ibuprofen (ADVIL,MOTRIN) 600 MG tablet Take 1 tablet (600 mg total) by mouth every 6 (six) hours. Patient not taking: Reported on 08/19/2014 12/02/12   Lavina Hammanodd Meisinger, MD  naproxen (NAPROSYN) 375 MG tablet Take 1 tablet (375 mg total) by mouth 2 (two) times daily with a meal. X 7 days for right knee pain Patient not taking: Reported on 08/19/2014 11/11/13   Ria ClockJennifer Lee H Presson, PA  ondansetron (ZOFRAN ODT) 8 MG disintegrating tablet 8mg  ODT q4 hours prn nausea 08/20/14   Geoffery Lyonsouglas Delo, MD  oxyCODONE-acetaminophen (PERCOCET/ROXICET) 5-325 MG per tablet Take 1-2 tablets by mouth every 4 (four) hours as needed. Patient not  taking: Reported on 08/19/2014 12/02/12   Lavina Hamman, MD   BP 107/71 mmHg  Pulse 75  Temp(Src) 98.2 F (36.8 C) (Oral)  Resp 16  SpO2 100%  LMP 09/15/2014  Breastfeeding? No Physical Exam  Constitutional: She is oriented to person, place, and time. She appears well-developed and well-nourished. No distress.  Non-toxic appearing. NAD  Neck: Normal range of motion.  Cardiovascular: Normal rate, regular rhythm and normal heart sounds.   Pulmonary/Chest: Effort normal and breath sounds normal. No respiratory distress. She has no  wheezes. She has no rales. She exhibits tenderness.  Tenderness to palpation along left upper chest, anterior shoulder and upper back. Full ROM of the left shoulder with reproduction of pain with ROM  Musculoskeletal: She exhibits tenderness. She exhibits no edema.  Lymphadenopathy:    She has no cervical adenopathy.  Neurological: She is alert and oriented to person, place, and time.  Skin: Skin is warm and dry. She is not diaphoretic.  Psychiatric: Her behavior is normal.  Nursing note and vitals reviewed.   ED Course  Procedures (including critical care time) Labs Review Labs Reviewed - No data to display  Imaging Review No results found.   MDM   1. Costochondral chest pain    Reproducible chest tenderness. Probable M/S in nature. Appears well and non-toxic. Treat here with Toradol  IM followed by a week of Diclofenac. Educated if worsening pain, or trouble breathing please f/u acutely in the ER.     Riki Sheer, PA-C 10/04/14 2025

## 2014-10-04 NOTE — ED Notes (Addendum)
C/o pain L ant. Chest and L post. Chest that radiates to shoulder and down her arm.  Pain onset yesterday AM.  She took Advil and went to work  ViacomCan't lay on her L side.  Pain is continuous.  C/o SOB but no nausea.  It hurts to swallow when she eats.  No sweating. No relief with Advil.  Pain worse with movement.  Hurts worse to bend over, sitting and laying down.

## 2015-07-28 ENCOUNTER — Other Ambulatory Visit: Payer: Self-pay | Admitting: Obstetrics and Gynecology

## 2015-07-28 ENCOUNTER — Other Ambulatory Visit (HOSPITAL_COMMUNITY)
Admission: RE | Admit: 2015-07-28 | Discharge: 2015-07-28 | Disposition: A | Discharge status: Home / Self Care | Payer: Managed Care, Other (non HMO) | Source: Ambulatory Visit | Attending: Obstetrics and Gynecology | Admitting: Obstetrics and Gynecology

## 2015-07-28 DIAGNOSIS — Z01419 Encounter for gynecological examination (general) (routine) without abnormal findings: Secondary | ICD-10-CM | POA: Diagnosis not present

## 2015-08-03 LAB — CYTOLOGY - PAP

## 2015-11-25 LAB — OB RESULTS CONSOLE GC/CHLAMYDIA
Chlamydia: NEGATIVE
Gonorrhea: NEGATIVE

## 2015-11-25 LAB — OB RESULTS CONSOLE ABO/RH: RH TYPE: POSITIVE

## 2015-11-25 LAB — OB RESULTS CONSOLE HEPATITIS B SURFACE ANTIGEN: HEP B S AG: NEGATIVE

## 2015-11-25 LAB — OB RESULTS CONSOLE RUBELLA ANTIBODY, IGM: RUBELLA: IMMUNE

## 2015-11-25 LAB — OB RESULTS CONSOLE ANTIBODY SCREEN: ANTIBODY SCREEN: NEGATIVE

## 2015-11-25 LAB — OB RESULTS CONSOLE HIV ANTIBODY (ROUTINE TESTING): HIV: NONREACTIVE

## 2015-11-25 LAB — OB RESULTS CONSOLE RPR: RPR: NONREACTIVE

## 2016-06-13 ENCOUNTER — Inpatient Hospital Stay (HOSPITAL_COMMUNITY)
Admission: AD | Admit: 2016-06-13 | Discharge: 2016-06-13 | Disposition: A | Discharge status: Home / Self Care | Payer: Managed Care, Other (non HMO) | Source: Ambulatory Visit | Attending: Obstetrics and Gynecology | Admitting: Obstetrics and Gynecology

## 2016-06-13 ENCOUNTER — Encounter (HOSPITAL_COMMUNITY): Payer: Self-pay | Admitting: *Deleted

## 2016-06-13 DIAGNOSIS — O471 False labor at or after 37 completed weeks of gestation: Secondary | ICD-10-CM | POA: Diagnosis not present

## 2016-06-13 DIAGNOSIS — M549 Dorsalgia, unspecified: Secondary | ICD-10-CM

## 2016-06-13 DIAGNOSIS — Z3A36 36 weeks gestation of pregnancy: Secondary | ICD-10-CM | POA: Diagnosis not present

## 2016-06-13 DIAGNOSIS — O99891 Other specified diseases and conditions complicating pregnancy: Secondary | ICD-10-CM

## 2016-06-13 DIAGNOSIS — O34211 Maternal care for low transverse scar from previous cesarean delivery: Secondary | ICD-10-CM | POA: Insufficient documentation

## 2016-06-13 DIAGNOSIS — O4703 False labor before 37 completed weeks of gestation, third trimester: Secondary | ICD-10-CM | POA: Insufficient documentation

## 2016-06-13 DIAGNOSIS — O479 False labor, unspecified: Secondary | ICD-10-CM

## 2016-06-13 DIAGNOSIS — Z3A37 37 weeks gestation of pregnancy: Secondary | ICD-10-CM | POA: Diagnosis not present

## 2016-06-13 DIAGNOSIS — O9989 Other specified diseases and conditions complicating pregnancy, childbirth and the puerperium: Secondary | ICD-10-CM

## 2016-06-13 DIAGNOSIS — Z3493 Encounter for supervision of normal pregnancy, unspecified, third trimester: Secondary | ICD-10-CM | POA: Diagnosis present

## 2016-06-13 LAB — URINALYSIS, ROUTINE W REFLEX MICROSCOPIC
Bilirubin Urine: NEGATIVE
Glucose, UA: NEGATIVE mg/dL
HGB URINE DIPSTICK: NEGATIVE
Ketones, ur: NEGATIVE mg/dL
Nitrite: NEGATIVE
Protein, ur: 30 mg/dL — AB
SPECIFIC GRAVITY, URINE: 1.019 (ref 1.005–1.030)
pH: 6 (ref 5.0–8.0)

## 2016-06-13 LAB — URINALYSIS, MICROSCOPIC (REFLEX): BACTERIA UA: NONE SEEN

## 2016-06-13 MED ORDER — NIFEDIPINE 10 MG PO CAPS
10.0000 mg | ORAL_CAPSULE | ORAL | Status: AC
  Administered 2016-06-13 (×3): 10 mg via ORAL
  Filled 2016-06-13 (×3): qty 1

## 2016-06-13 MED ORDER — NIFEDIPINE 10 MG PO CAPS: 10.0000 mg | ORAL_CAPSULE | Freq: Four times a day (QID) | ORAL | 0 refills | Status: DC | PRN

## 2016-06-13 MED ORDER — LACTATED RINGERS IV BOLUS (SEPSIS)
1000.0000 mL | Freq: Once | INTRAVENOUS | Status: AC
  Administered 2016-06-13: 1000 mL via INTRAVENOUS

## 2016-06-13 NOTE — MAU Note (Signed)
Has been contracting, now every 5 min.  Having constant back pain, has been nauseated and dizzy.  Had diarrhea this morning.

## 2016-06-13 NOTE — MAU Provider Note (Signed)
S:  [name] is a 25 y.o. female G2P1001 @ 6982w6d here in MAU for a labor evaluation. The patient plans a repeat cesarean section. She has had contraction like pain and lower back pain in the last 24 hours. She called her Dr. And it was recommended she come in for evaluation.   O:  GENERAL: Well-developed, well-nourished female in no acute distress.  LUNGS: Effort normal SKIN: Warm, dry and without erythema PSYCH: Normal mood and affect  Vitals:   06/13/16 1626 06/13/16 1721  BP: 111/73 118/62  Pulse: 86 102  Resp:  18  Temp:     MDM:  RN labor evaluation. RN discussed contraction pattern with me. IV fluids infusing per Dr. Richardson Doppole. Medical screen done due to patient's amount of time in MAU. Contacted Dr. Richardson Doppole regarding patients concern/worry about early labor, not knowing when to come back. Patient having frequent uterine irritability on the monitor with occasional contractions. Dr. Richardson Doppole made aware. Will try procardia x 3 doses per Dr. Richardson Doppole and plan to send home with procardia PRN.  Following procardia patient continues to have lower abdominal cramping and lower back pain. Fetal monitor shows frequent uterine irritability.  Pain is unchanged from arrival. Per the RN the patient's cervix is unchanged Dilation: Fingertip Effacement (%): Thick Cervical Position: Posterior Exam by:: L. SEymour RN   Dr. Richardson Doppole made aware- ok to discharge home.    A:  1. Braxton Hicks contractions   2. Back pain affecting pregnancy in third trimester   3.      Category 1 fetal tracing.   P:  Discharge home in stable condition Strict return precautions Follow up with Dr. Richardson Doppole Labor precautions Rx: procardia   Duane LopeJennifer I Rasch, NP 06/13/2016 6:02 PM

## 2016-06-13 NOTE — MAU Note (Signed)
Urine in lab 

## 2016-06-13 NOTE — Discharge Instructions (Signed)
Braxton Hicks Contractions °Contractions of the uterus can occur throughout pregnancy. Contractions are not always a sign that you are in labor.  °WHAT ARE BRAXTON HICKS CONTRACTIONS?  °Contractions that occur before labor are called Braxton Hicks contractions, or false labor. Toward the end of pregnancy (32-34 weeks), these contractions can develop more often and may become more forceful. This is not true labor because these contractions do not result in opening (dilatation) and thinning of the cervix. They are sometimes difficult to tell apart from true labor because these contractions can be forceful and people have different pain tolerances. You should not feel embarrassed if you go to the hospital with false labor. Sometimes, the only way to tell if you are in true labor is for your health care provider to look for changes in the cervix. °If there are no prenatal problems or other health problems associated with the pregnancy, it is completely safe to be sent home with false labor and await the onset of true labor. °HOW CAN YOU TELL THE DIFFERENCE BETWEEN TRUE AND FALSE LABOR? °False Labor  °· The contractions of false labor are usually shorter and not as hard as those of true labor.   °· The contractions are usually irregular.   °· The contractions are often felt in the front of the lower abdomen and in the groin.   °· The contractions may go away when you walk around or change positions while lying down.   °· The contractions get weaker and are shorter lasting as time goes on.   °· The contractions do not usually become progressively stronger, regular, and closer together as with true labor.   °True Labor  °· Contractions in true labor last 30-70 seconds, become very regular, usually become more intense, and increase in frequency.   °· The contractions do not go away with walking.   °· The discomfort is usually felt in the top of the uterus and spreads to the lower abdomen and low back.   °· True labor can be  determined by your health care provider with an exam. This will show that the cervix is dilating and getting thinner.   °WHAT TO REMEMBER °· Keep up with your usual exercises and follow other instructions given by your health care provider.   °· Take medicines as directed by your health care provider.   °· Keep your regular prenatal appointments.   °· Eat and drink lightly if you think you are going into labor.   °· If Braxton Hicks contractions are making you uncomfortable:   °¨ Change your position from lying down or resting to walking, or from walking to resting.   °¨ Sit and rest in a tub of warm water.   °¨ Drink 2-3 glasses of water. Dehydration may cause these contractions.   °¨ Do slow and deep breathing several times an hour.   °WHEN SHOULD I SEEK IMMEDIATE MEDICAL CARE? °Seek immediate medical care if: °· Your contractions become stronger, more regular, and closer together.   °· You have fluid leaking or gushing from your vagina.   °· You have a fever.   °· You pass blood-tinged mucus.   °· You have vaginal bleeding.   °· You have continuous abdominal pain.   °· You have low back pain that you never had before.   °· You feel your baby's head pushing down and causing pelvic pressure.   °· Your baby is not moving as much as it used to.   °This information is not intended to replace advice given to you by your health care provider. Make sure you discuss any questions you have with your health care   provider. °Document Released: 06/20/2005 Document Revised: 10/12/2015 Document Reviewed: 04/01/2013 °Elsevier Interactive Patient Education © 2017 Elsevier Inc. ° °

## 2016-06-14 LAB — CULTURE, OB URINE
CULTURE: NO GROWTH
Special Requests: NORMAL

## 2016-06-16 LAB — OB RESULTS CONSOLE GBS: GBS: NEGATIVE

## 2016-06-24 ENCOUNTER — Telehealth (HOSPITAL_COMMUNITY): Payer: Self-pay | Admitting: *Deleted

## 2016-06-24 NOTE — Telephone Encounter (Signed)
Preadmission screen  

## 2016-06-28 ENCOUNTER — Encounter (HOSPITAL_COMMUNITY): Payer: Self-pay

## 2016-07-01 ENCOUNTER — Encounter (HOSPITAL_COMMUNITY)
Admission: RE | Admit: 2016-07-01 | Discharge: 2016-07-01 | Disposition: A | Discharge status: Home / Self Care | Payer: Managed Care, Other (non HMO) | Source: Ambulatory Visit | Attending: Obstetrics and Gynecology | Admitting: Obstetrics and Gynecology

## 2016-07-01 ENCOUNTER — Other Ambulatory Visit: Payer: Self-pay | Admitting: Obstetrics and Gynecology

## 2016-07-01 NOTE — Patient Instructions (Signed)
18 Megan Skinner  07/01/2016   Your procedure is scheduled on:  07/05/2016  [facility].  Pick up the phone at the desk and dial [phone].   Call this number if you have problems the morning of surgery: [phone]   Remember:   Do not eat food:After Midnight.  Do not drink clear liquids: After Midnight.  Take these medicines the morning of surgery with A SIP OF WATER: none   Do not wear jewelry, make-up or nail polish.  Do not wear lotions, powders, or perfumes. Do not wear deodorant.  Do not shave 48 hours prior to surgery.  Do not bring valuables to the hospital.  [facility] is not   responsible for any belongings or valuables brought to the hospital.  Contacts, dentures or bridgework may not be worn into surgery.  Leave suitcase in the car. After surgery it may be brought to your room.  For patients admitted to the hospital, checkout time is 11:00 AM the day of              discharge.   Patients discharged the day of surgery will not be allowed to drive             home.  Name and phone number of your driver: na  Special Instructions:   N/A   Please read over the following fact sheets that you were given:   Surgical Site Infection Prevention

## 2016-07-03 ENCOUNTER — Encounter (HOSPITAL_COMMUNITY): Payer: Self-pay | Admitting: *Deleted

## 2016-07-03 ENCOUNTER — Inpatient Hospital Stay (HOSPITAL_COMMUNITY)
Admission: AD | Admit: 2016-07-03 | Discharge: 2016-07-03 | Disposition: A | Discharge status: Home / Self Care | Payer: Managed Care, Other (non HMO) | Source: Ambulatory Visit | Attending: Obstetrics and Gynecology | Admitting: Obstetrics and Gynecology

## 2016-07-03 DIAGNOSIS — Z3A00 Weeks of gestation of pregnancy not specified: Secondary | ICD-10-CM

## 2016-07-03 LAB — AMNISURE RUPTURE OF MEMBRANE (ROM) NOT AT ARMC: Amnisure ROM: NEGATIVE

## 2016-07-03 NOTE — MAU Note (Signed)
Pt states she started leaking fluid around 0830 yesterday morning.  Pt states she had contractions that were 3-5 minutes apart from 0900 to 1130.  Since then the contractions are about 8 minutes apart.  Pt states she is feeling the baby move.

## 2016-07-03 NOTE — MAU Provider Note (Signed)
  History     CSN: [id number]  Arrival date and time: 07/03/16 1231   None     Chief Complaint  Patient presents with  . Rupture of Membranes  . Contractions   HPI  Pt presents with contractions and questionable LOF.  Good FM,   Past Medical History:  Diagnosis Date  . Medical history non-contributory     Past Surgical History:  Procedure Laterality Date  . ABDOMINAL HERNIA REPAIR  2001  . CESAREAN SECTION N/A 11/29/2012   Procedure: CESAREAN SECTION;  Surgeon: Bing Plumehomas F Henley, MD;  Location: [facility];  Service: Obstetrics;  Laterality: N/A;  . HERNIA REPAIR      Family History  Problem Relation Age of Onset  . Diabetes Father   . Diabetes Maternal Grandmother   . Hyperlipidemia Maternal Grandmother   . Cancer Maternal Grandmother     peritoneal, ovarian  . Hypertension Maternal Grandmother   . Diabetes Maternal Grandfather   . Hyperlipidemia Maternal Grandfather   . Stroke Maternal Grandfather   . Heart disease Maternal Grandfather   . Heart attack Maternal Grandfather   . Arthritis Paternal Grandfather   . Diabetes Paternal Grandfather   . Anemia Mother   . Hypertension Mother   . Heart disease Paternal Grandmother   . Hypertension Paternal Grandmother   . Heart attack Paternal Grandmother   . Depression Maternal Aunt   . Hearing loss Neg Hx     Social History  Substance Use Topics  . Smoking status: Never Smoker  . Smokeless tobacco: Never Used  . Alcohol use No    Allergies: No Known Allergies  Prescriptions Prior to Admission  Medication Sig Dispense Refill Last Dose  . Prenatal Vit-Fe Fumarate-FA (PRENATAL MULTIVITAMIN) TABS tablet Take 1 tablet by mouth daily.    07/03/2016 at Unknown time    ROS Physical Exam   Blood pressure 128/70, pulse 74, temperature 98.1 F (36.7 C), temperature source Oral, resp. rate 18.  Physical Exam  MAU Course  Procedures  MDM   Assessment and Plan  Cat 1  amnisure neg Pt offered OBS or DC home.   She wants to go home. Labor and SROM reviewed again  [facility] Silver[facility] HospitalDILLARD,Mihcael Ledee A 07/03/2016, 3:13 PM

## 2016-07-03 NOTE — Discharge Instructions (Signed)
Third Trimester of Pregnancy The third trimester is from week 29 through week 40 (months 7 through 9). The third trimester is a time when the unborn baby (fetus) is growing rapidly. At the end of the ninth month, the fetus is about 20 inches in length and weighs 6-10 pounds. Body changes during your third trimester Your body goes through many changes during pregnancy. The changes vary from woman to woman. During the third trimester: Your weight will continue to increase. You can expect to gain 25-35 pounds (11-16 kg) by the end of the pregnancy. You may begin to get stretch marks on your hips, abdomen, and breasts. You may urinate more often because the fetus is moving lower into your pelvis and pressing on your bladder. You may develop or continue to have heartburn. This is caused by increased hormones that slow down muscles in the digestive tract. You may develop or continue to have constipation because increased hormones slow digestion and cause the muscles that push waste through your intestines to relax. You may develop hemorrhoids. These are swollen veins (varicose veins) in the rectum that can itch or be painful. You may develop swollen, bulging veins (varicose veins) in your legs. You may have increased body aches in the pelvis, back, or thighs. This is due to weight gain and increased hormones that are relaxing your joints. You may have changes in your hair. These can include thickening of your hair, rapid growth, and changes in texture. Some women also have hair loss during or after pregnancy, or hair that feels dry or thin. Your hair will most likely return to normal after your baby is born. Your breasts will continue to grow and they will continue to become tender. A yellow fluid (colostrum) may leak from your breasts. This is the first milk you are producing for your baby. Your belly button may stick out. You may notice more swelling in your hands, face, or ankles. You may have increased  tingling or numbness in your hands, arms, and legs. The skin on your belly may also feel numb. You may feel short of breath because of your expanding uterus. You may have more problems sleeping. This can be caused by the size of your belly, increased need to urinate, and an increase in your body's metabolism. You may notice the fetus "dropping," or moving lower in your abdomen. You may have increased vaginal discharge. Your cervix becomes thin and soft (effaced) near your due date. What to expect at prenatal visits You will have prenatal exams every 2 weeks until week 36. Then you will have weekly prenatal exams. During a routine prenatal visit: You will be weighed to make sure you and the fetus are growing normally. Your blood pressure will be taken. Your abdomen will be measured to track your baby's growth. The fetal heartbeat will be listened to. Any test results from the previous visit will be discussed. You may have a cervical check near your due date to see if you have effaced. At around 36 weeks, your health care provider will check your cervix. At the same time, your health care provider will also perform a test on the secretions of the vaginal tissue. This test is to determine if a type of bacteria, Group B streptococcus, is present. Your health care provider will explain this further. Your health care provider may ask you: What your birth plan is. How you are feeling. If you are feeling the baby move. If you have had any abnormal symptoms, such  as leaking fluid, bleeding, severe headaches, or abdominal cramping. If you are using any tobacco products, including cigarettes, chewing tobacco, and electronic cigarettes. If you have any questions. Other tests or screenings that may be performed during your third trimester include: Blood tests that check for low iron levels (anemia). Fetal testing to check the health, activity level, and growth of the fetus. Testing is done if you have  certain medical conditions or if there are problems during the pregnancy. Nonstress test (NST). This test checks the health of your baby to make sure there are no signs of problems, such as the baby not getting enough oxygen. During this test, a belt is placed around your belly. The baby is made to move, and its heart rate is monitored during movement. What is false labor? False labor is a condition in which you feel small, irregular tightenings of the muscles in the womb (contractions) that eventually go away. These are called Braxton Hicks contractions. Contractions may last for hours, days, or even weeks before true labor sets in. If contractions come at regular intervals, become more frequent, increase in intensity, or become painful, you should see your health care provider. What are the signs of labor? Abdominal cramps. Regular contractions that start at 10 minutes apart and become stronger and more frequent with time. Contractions that start on the top of the uterus and spread down to the lower abdomen and back. Increased pelvic pressure and dull back pain. A watery or bloody mucus discharge that comes from the vagina. Leaking of amniotic fluid. This is also known as your "water breaking." It could be a slow trickle or a gush. Let your doctor know if it has a color or strange odor. If you have any of these signs, call your health care provider right away, even if it is before your due date. Follow these instructions at home: Eating and drinking Continue to eat regular, healthy meals. Do not eat: Raw meat or meat spreads. Unpasteurized milk or cheese. Unpasteurized juice. Store-made salad. Refrigerated smoked seafood. Hot dogs or deli meat, unless they are piping hot. More than 6 ounces of albacore tuna a week. Shark, swordfish, king mackerel, or tile fish. Store-made salads. Raw sprouts, such as mung bean or alfalfa sprouts. Take prenatal vitamins as told by your health care  provider. Take 1000 mg of calcium daily as told by your health care provider. If you develop constipation: Take over-the-counter or prescription medicines. Drink enough fluid to keep your urine clear or pale yellow. Eat foods that are high in fiber, such as fresh fruits and vegetables, whole grains, and beans. Limit foods that are high in fat and processed sugars, such as fried and sweet foods. Activity Exercise only as directed by your health care provider. Healthy pregnant women should aim for 2 hours and 30 minutes of moderate exercise per week. If you experience any pain or discomfort while exercising, stop. Avoid heavy lifting. Do not exercise in extreme heat or humidity, or at high altitudes. Wear low-heel, comfortable shoes. Practice good posture. Do not travel far distances unless it is absolutely necessary and only with the approval of your health care provider. Wear your seat belt at all times while in a car, on a bus, or on a plane. Take frequent breaks and rest with your legs elevated if you have leg cramps or low back pain. Do not use hot tubs, steam rooms, or saunas. You may continue to have sex unless your health care provider tells you  otherwise. Lifestyle Do not use any products that contain nicotine or tobacco, such as cigarettes and e-cigarettes. If you need help quitting, ask your health care provider. Do not drink alcohol. Do not use any medicinal herbs or unprescribed drugs. These chemicals affect the formation and growth of the baby. If you develop varicose veins: Wear support pantyhose or compression stockings as told by your healthcare provider. Elevate your feet for 15 minutes, 3-4 times a day. Wear a supportive maternity bra to help with breast tenderness. General instructions Take over-the-counter and prescription medicines only as told by your health care provider. There are medicines that are either safe or unsafe to take during pregnancy. Take warm sitz  baths to soothe any pain or discomfort caused by hemorrhoids. Use hemorrhoid cream or witch hazel if your health care provider approves. Avoid cat litter boxes and soil used by cats. These carry germs that can cause birth defects in the baby. If you have a cat, ask someone to clean the litter box for you. To prepare for the arrival of your baby: Take prenatal classes to understand, practice, and ask questions about the labor and delivery. Make a trial run to the hospital. Visit the hospital and tour the maternity area. Arrange for maternity or paternity leave through employers. Arrange for family and friends to take care of pets while you are in the hospital. Purchase a rear-facing car seat and make sure you know how to install it in your car. Pack your hospital bag. Prepare the babys nursery. Make sure to remove all pillows and stuffed animals from the baby's crib to prevent suffocation. Visit your dentist if you have not gone during your pregnancy. Use a soft toothbrush to brush your teeth and be gentle when you floss. Keep all prenatal follow-up visits as told by your health care provider. This is important. Contact a health care provider if: You are unsure if you are in labor or if your water has broken. You become dizzy. You have mild pelvic cramps, pelvic pressure, or nagging pain in your abdominal area. You have lower back pain. You have persistent nausea, vomiting, or diarrhea. You have an unusual or bad smelling vaginal discharge. You have pain when you urinate. Get help right away if: You have a fever. You are leaking fluid from your vagina. You have spotting or bleeding from your vagina. You have severe abdominal pain or cramping. You have rapid weight loss or weight gain. You have shortness of breath with chest pain. You notice sudden or extreme swelling of your face, hands, ankles, feet, or legs. Your baby makes fewer than 10 movements in 2 hours. You have severe headaches  that do not go away with medicine. You have vision changes. Summary The third trimester is from week 29 through week 40, months 7 through 9. The third trimester is a time when the unborn baby (fetus) is growing rapidly. During the third trimester, your discomfort may increase as you and your baby continue to gain weight. You may have abdominal, leg, and back pain, sleeping problems, and an increased need to urinate. During the third trimester your breasts will keep growing and they will continue to become tender. A yellow fluid (colostrum) may leak from your breasts. This is the first milk you are producing for your baby. False labor is a condition in which you feel small, irregular tightenings of the muscles in the womb (contractions) that eventually go away. These are called Braxton Hicks contractions. Contractions may last for hours,  days, or even weeks before true labor sets in. Signs of labor can include: abdominal cramps; regular contractions that start at 10 minutes apart and become stronger and more frequent with time; watery or bloody mucus discharge that comes from the vagina; increased pelvic pressure and dull back pain; and leaking of amniotic fluid. This information is not intended to replace advice given to you by your health care provider. Make sure you discuss any questions you have with your health care provider. Document Released: 06/14/2001 Document Revised: 11/26/2015 Document Reviewed: 08/21/2012 Elsevier Interactive Patient Education  2017 [employer] Introduction Patient Name: ________________________________________________ Patient Due Date: ____________________ What is a fetal movement count? A fetal movement count is the number of times that you feel your baby move during a certain amount of time. This may also be called a fetal kick count. A fetal movement count is recommended for every pregnant woman. You may be asked to start counting fetal movements as early as week 28  of your pregnancy. Pay attention to when your baby is most active. You may notice your baby's sleep and wake cycles. You may also notice things that make your baby move more. You should do a fetal movement count: When your baby is normally most active. At the same time each day. A good time to count movements is while you are resting, after having something to eat and drink. How do I count fetal movements? Find a quiet, comfortable area. Sit, or lie down on your side. Write down the date, the start time and stop time, and the number of movements that you felt between those two times. Take this information with you to your health care visits. For 2 hours, count kicks, flutters, swishes, rolls, and jabs. You should feel at least 10 movements during 2 hours. You may stop counting after you have felt 10 movements. If you do not feel 10 movements in 2 hours, have something to eat and drink. Then, keep resting and counting for 1 hour. If you feel at least 4 movements during that hour, you may stop counting. Contact a health care provider if: You feel fewer than 4 movements in 2 hours. Your baby is not moving like he or she usually does. Date: ____________ Start time: ____________ Stop time: ____________ Movements: ____________ Date: ____________ Start time: ____________ Stop time: ____________ Movements: ____________ Date: ____________ Start time: ____________ Stop time: ____________ Movements: ____________ Date: ____________ Start time: ____________ Stop time: ____________ Movements: ____________ Date: ____________ Start time: ____________ Stop time: ____________ Movements: ____________ Date: ____________ Start time: ____________ Stop time: ____________ Movements: ____________ Date: ____________ Start time: ____________ Stop time: ____________ Movements: ____________ Date: ____________ Start time: ____________ Stop time: ____________ Movements: ____________ Date: ____________ Start time:  ____________ Stop time: ____________ Movements: ____________ This information is not intended to replace advice given to you by your health care provider. Make sure you discuss any questions you have with your health care provider. Document Released: 07/20/2006 Document Revised: 02/17/2016 Document Reviewed: 07/30/2015 Elsevier Interactive Patient Education  2017 [employer]. Cesarean Delivery Cesarean birth, or cesarean delivery, is the surgical delivery of a baby through an incision in the abdomen and the uterus. This may be referred to as a C-section. This procedure may be scheduled ahead of time, or it may be done in an emergency situation. Tell a health care provider about:  Any allergies you have.  All medicines you are taking, including vitamins, herbs, eye drops, creams, and over-the-counter medicines.  Any problems you or family  members have had with anesthetic medicines.  Any blood disorders you have.  Any surgeries you have had.  Any medical conditions you have.  Whether you or any members of your family have a history of deep vein thrombosis (DVT) or pulmonary embolism (PE). What are the risks? Generally, this is a safe procedure. However, problems may occur, including:  Infection.  Bleeding.  Allergic reactions to medicines.  Damage to other structures or organs.  Blood clots.  Injury to your baby. What happens before the procedure?  Follow instructions from your health care provider about eating or drinking restrictions.  Follow instructions from your health care provider about bathing before your procedure to help reduce your risk of infection.  If you know that you are going to have a cesarean delivery, do not shave your pubic area. Shaving before the procedure may increase your risk of infection.  Ask your health care provider about:  Changing or stopping your regular medicines. This is especially important if you are taking diabetes medicines or blood  thinners.  Your pain management plan. This is especially important if you plan to breastfeed your baby.  How long you will be in the hospital after the procedure.  Any concerns you may have about receiving blood products if you need them during the procedure.  Cord blood banking, if you plan to collect your babys umbilical cord blood.  You may also want to ask your health care provider:  Whether you will be able to hold or breastfeed your baby while you are still in the operating room.  Whether your baby can stay with you immediately after the procedure and during your recovery.  Whether a family member or a person of your choice can go with you into the operating room and stay with you during the procedure, immediately after the procedure, and during your recovery.  Plan to have someone drive you home when you are discharged from the hospital. What happens during the procedure?  Fetal monitors will be placed on your abdomen to monitor your heart rate and your baby's heart rate.  Depending on the reason for your cesarean delivery, you may have a physical exam or additional testing, such as an ultrasound.  An IV tube will be inserted into one of your veins.  You may have your blood or urine tested.  You will be given antibiotic medicine to help prevent infection.  You may be given a special warming gown to wear to keep your temperature stable.  Hair may be removed from your pubic area.  The skin of your pubic area and lower abdomen will be cleaned with a germ-killing solution (antiseptic).  A catheter may be inserted into your bladder through your urethra. This drains your urine during the procedure.  You may be given one or more of the following:  A medicine to numb the area (local anesthetic).  A medicine to make you fall asleep (general anesthetic).  A medicine (regional anesthetic) that is injected into your back or through a small thin tube placed in your back  (spinal anesthetic or epidural anesthetic). This numbs everything below the injection site and allows you to stay awake during your procedure. If this makes you feel nauseous, tell your health care provider. Medicines will be available to help reduce any nausea you may feel.  An incision will be made in your abdomen, and then in your uterus.  If you are awake during your procedure, you may feel tugging and pulling in your  abdomen, but you should not feel pain. If you feel pain, tell your health care provider immediately.  Your baby will be removed from your uterus. You may feel more pressure or pushing while this happens.  Immediately after birth, your baby will be dried and kept warm. You may be able to hold and breastfeed your baby. The umbilical cord may be clamped and cut during this time.  Your placenta will be removed from your uterus.  Your incisions will be closed with stitches (sutures). Staples, skin glue, or adhesive strips may also be applied to the incision in your abdomen.  Bandages (dressings) will be placed over the incision in your abdomen. The procedure may vary among health care providers and hospitals. What happens after the procedure?  Your blood pressure, heart rate, breathing rate, and blood oxygen level will be monitored often until the medicines you were given have worn off.  You may continue to receive fluids and medicines through an IV tube.  You will have some pain. Medicines will be available to help control your pain.  To help prevent blood clots:  You may be given medicines.  You may have to wear compression stockings or devices.  You will be encouraged to walk around when you are able.  Hospital staff will encourage and support bonding with your baby. Your hospital may allow you and your baby to stay in the same room (rooming in) during your hospital stay to encourage successful breastfeeding.  You may be encouraged to cough and breathe deeply often.  This helps to prevent lung problems.  If you have a catheter draining your urine, it will be removed as soon as possible after your procedure. This information is not intended to replace advice given to you by your health care provider. Make sure you discuss any questions you have with your health care provider. Document Released: 06/20/2005 Document Revised: 11/26/2015 Document Reviewed: 03/31/2015 Elsevier Interactive Patient Education  2017 [employer]

## 2016-07-04 ENCOUNTER — Other Ambulatory Visit (HOSPITAL_COMMUNITY)
Admission: AD | Admit: 2016-07-04 | Discharge: 2016-07-04 | Disposition: A | Discharge status: Home / Self Care | Payer: Managed Care, Other (non HMO) | Source: Ambulatory Visit | Attending: Obstetrics and Gynecology | Admitting: Obstetrics and Gynecology

## 2016-07-04 ENCOUNTER — Other Ambulatory Visit: Payer: Self-pay | Admitting: Obstetrics and Gynecology

## 2016-07-04 DIAGNOSIS — O34219 Maternal care for unspecified type scar from previous cesarean delivery: Secondary | ICD-10-CM | POA: Insufficient documentation

## 2016-07-04 LAB — CBC
HCT: 32 % — ABNORMAL LOW (ref 36.0–46.0)
Hemoglobin: 10.3 g/dL — ABNORMAL LOW (ref 12.0–15.0)
MCH: 22.3 pg — ABNORMAL LOW (ref 26.0–34.0)
MCHC: 32.2 g/dL (ref 30.0–36.0)
MCV: 69.3 fL — AB (ref 78.0–100.0)
PLATELETS: 179 10*3/uL (ref 150–400)
RBC: 4.62 MIL/uL (ref 3.87–5.11)
RDW: 15.8 % — ABNORMAL HIGH (ref 11.5–15.5)
WBC: 6 10*3/uL (ref 4.0–10.5)

## 2016-07-04 LAB — ABO/RH: ABO/RH(D): A POS

## 2016-07-04 NOTE — H&P (Deleted)
  The note originally documented on this encounter has been moved the the encounter in which it belongs.  

## 2016-07-04 NOTE — Anesthesia Preprocedure Evaluation (Signed)
Anesthesia Evaluation  Patient identified by MRN, date of birth, ID band Patient awake    Reviewed: Allergy & Precautions, H&P , Patient's Chart, lab work & pertinent test results  Airway Mallampati: II  TM Distance: >3 FB Neck ROM: full    Dental no notable dental hx.    Pulmonary    Pulmonary exam normal breath sounds clear to auscultation       Cardiovascular Exercise Tolerance: Good  Rhythm:regular Rate:Normal     Neuro/Psych    GI/Hepatic   Endo/Other    Renal/GU      Musculoskeletal   Abdominal   Peds  Hematology   Anesthesia Other Findings   Reproductive/Obstetrics                             Anesthesia Physical Anesthesia Plan  ASA: II  Anesthesia Plan: Spinal   Post-op Pain Management:    Induction:   Airway Management Planned:   Additional Equipment:   Intra-op Plan:   Post-operative Plan:   Informed Consent: I have reviewed the patients History and Physical, chart, labs and discussed the procedure including the risks, benefits and alternatives for the proposed anesthesia with the patient or authorized representative who has indicated his/her understanding and acceptance.   Dental Advisory Given  Plan Discussed with: CRNA  Anesthesia Plan Comments: (Lab work and procedure  confirmed with CRNA in room; platelets okay. Discussed spinal anesthetic, and patient consents to the procedure:  included risk of possible headache,backache, failed block, allergic reaction, and nerve injury. This patient was asked if she had any questions or concerns before the procedure started.  )        Anesthesia Quick Evaluation  

## 2016-07-04 NOTE — H&P (Signed)
Megan Skinner is a 26 y.o. female presenting for repeat cesarean section. Prenatal care provided by Dr. Gerald Leitzara Mardy Lucier at [facility]. Pregnancy complicated by h/o cesarean section. Pt desires repeat cesarean section. She declines TOL.    OB History    Gravida Para Term Preterm AB Living   2 1 1     1    SAB TAB Ectopic Multiple Live Births           1     Past Medical History:  Diagnosis Date  . Medical history non-contributory    Past Surgical History:  Procedure Laterality Date  . ABDOMINAL HERNIA REPAIR  2001  . CESAREAN SECTION N/A 11/29/2012   Procedure: CESAREAN SECTION;  Surgeon: Bing Plumehomas F Henley, MD;  Location: [facility];  Service: Obstetrics;  Laterality: N/A;  . HERNIA REPAIR     Family History: family history includes Anemia in her mother; Arthritis in her paternal grandfather; Cancer in her maternal grandmother; Depression in her maternal aunt; Diabetes in her father, maternal grandfather, maternal grandmother, and paternal grandfather; Heart attack in her maternal grandfather and paternal grandmother; Heart disease in her maternal grandfather and paternal grandmother; Hyperlipidemia in her maternal grandfather and maternal grandmother; Hypertension in her maternal grandmother, mother, and paternal grandmother; Stroke in her maternal grandfather. Social History:  reports that she has never smoked. She has never used smokeless tobacco. She reports that she does not drink alcohol or use drugs.     Maternal Diabetes: No Genetic Screening: Normal Maternal Ultrasounds/Referrals: Normal Fetal Ultrasounds or other Referrals:  Fetal echo Maternal Substance Abuse:  No Significant Maternal Medications:  None Significant Maternal Lab Results:  None Other Comments:  None  Review of Systems  Constitutional: Negative.   HENT: Negative.   Eyes: Negative.   Respiratory: Negative.   Cardiovascular: Negative.   Gastrointestinal: Negative.   Genitourinary: Negative.   Musculoskeletal:  Negative.   Skin: Negative.   Neurological: Negative.   Endo/Heme/Allergies: Negative.   Psychiatric/Behavioral: Negative.    History   There were no vitals taken for this visit. Exam Physical Exam  Vitals reviewed. Constitutional: She is oriented to person, place, and time. She appears well-developed and well-nourished.  HENT:  Head: Normocephalic and atraumatic.  Eyes: Pupils are equal, round, and reactive to light.  Neck: Normal range of motion. Neck supple.  Cardiovascular: Normal rate and regular rhythm.   Respiratory: Effort normal and breath sounds normal.  GI: There is no tenderness.  Genitourinary: Vagina normal.  Musculoskeletal: Normal range of motion. She exhibits no edema.  Neurological: She is alert and oriented to person, place, and time. She has normal reflexes.  Skin: Skin is warm and dry.  Psychiatric: She has a normal mood and affect.    Prenatal labs: ABO, Rh: --/--/A POS, A POS (01/01 1210) Antibody: NEG (01/01 1210) Rubella: Immune (05/24 0000) RPR: Nonreactive (05/24 0000)  HBsAg: Negative (05/24 0000)  HIV: Non-reactive (05/24 0000)  GBS: Negative (12/14 0000)   Assessment/Plan: [redacted] wks EGA with h/o cesarean section. Pt declines TOL. She desires repeat cesarean section. R/b/a/ of cesarean section discussed with patient including but not limited to infection/ bleeding damage to surrounding organs with the need for further surgery. R/o Transfusion discussed. Pt voiced understanding and desires to proceed with repeat cesarean section.    Haelee Bolen J. 07/04/2016, 10:55 PM

## 2016-07-05 ENCOUNTER — Encounter (HOSPITAL_COMMUNITY)
Admission: RE | Disposition: A | Discharge status: Home / Self Care | Payer: Self-pay | Source: Ambulatory Visit | Attending: Obstetrics and Gynecology

## 2016-07-05 ENCOUNTER — Inpatient Hospital Stay (HOSPITAL_COMMUNITY)
Admission: RE | Admit: 2016-07-05 | Discharge: 2016-07-07 | DRG: 765 | Disposition: A | Discharge status: Home / Self Care | Payer: Managed Care, Other (non HMO) | Source: Ambulatory Visit | Attending: Obstetrics and Gynecology | Admitting: Obstetrics and Gynecology

## 2016-07-05 ENCOUNTER — Encounter (HOSPITAL_COMMUNITY): Payer: Self-pay

## 2016-07-05 ENCOUNTER — Inpatient Hospital Stay (HOSPITAL_COMMUNITY): Payer: Managed Care, Other (non HMO) | Admitting: Anesthesiology

## 2016-07-05 DIAGNOSIS — Z833 Family history of diabetes mellitus: Secondary | ICD-10-CM | POA: Diagnosis not present

## 2016-07-05 DIAGNOSIS — Z3A39 39 weeks gestation of pregnancy: Secondary | ICD-10-CM | POA: Diagnosis not present

## 2016-07-05 DIAGNOSIS — Z3493 Encounter for supervision of normal pregnancy, unspecified, third trimester: Secondary | ICD-10-CM | POA: Diagnosis present

## 2016-07-05 DIAGNOSIS — O9081 Anemia of the puerperium: Secondary | ICD-10-CM | POA: Diagnosis not present

## 2016-07-05 DIAGNOSIS — D62 Acute posthemorrhagic anemia: Secondary | ICD-10-CM | POA: Diagnosis not present

## 2016-07-05 DIAGNOSIS — Z8249 Family history of ischemic heart disease and other diseases of the circulatory system: Secondary | ICD-10-CM | POA: Diagnosis not present

## 2016-07-05 DIAGNOSIS — Z823 Family history of stroke: Secondary | ICD-10-CM | POA: Diagnosis not present

## 2016-07-05 DIAGNOSIS — O34211 Maternal care for low transverse scar from previous cesarean delivery: Principal | ICD-10-CM | POA: Diagnosis present

## 2016-07-05 DIAGNOSIS — Z98891 History of uterine scar from previous surgery: Secondary | ICD-10-CM

## 2016-07-05 LAB — RPR: RPR: NONREACTIVE

## 2016-07-05 LAB — PREPARE RBC (CROSSMATCH)

## 2016-07-05 SURGERY — Surgical Case
Anesthesia: Spinal

## 2016-07-05 MED ORDER — NALBUPHINE HCL 10 MG/ML IJ SOLN: 5.0000 mg | INTRAMUSCULAR | Status: DC | PRN

## 2016-07-05 MED ORDER — KETOROLAC TROMETHAMINE 30 MG/ML IJ SOLN
30.0000 mg | Freq: Once | INTRAMUSCULAR | Status: AC
  Administered 2016-07-05: 30 mg via INTRAMUSCULAR

## 2016-07-05 MED ORDER — ACETAMINOPHEN 500 MG PO TABS
1000.0000 mg | ORAL_TABLET | Freq: Four times a day (QID) | ORAL | Status: AC
  Administered 2016-07-05 – 2016-07-06 (×2): 1000 mg via ORAL
  Filled 2016-07-05 (×2): qty 2

## 2016-07-05 MED ORDER — METHYLERGONOVINE MALEATE 0.2 MG PO TABS: 0.2000 mg | ORAL_TABLET | ORAL | Status: DC | PRN

## 2016-07-05 MED ORDER — SOD CITRATE-CITRIC ACID 500-334 MG/5ML PO SOLN
30.0000 mL | ORAL | Status: AC
  Administered 2016-07-05: 30 mL via ORAL

## 2016-07-05 MED ORDER — SENNOSIDES-DOCUSATE SODIUM 8.6-50 MG PO TABS
2.0000 | ORAL_TABLET | ORAL | Status: DC
  Administered 2016-07-06 – 2016-07-07 (×2): 2 via ORAL
  Filled 2016-07-05 (×2): qty 2

## 2016-07-05 MED ORDER — DIBUCAINE 1 % RE OINT: 1.0000 "application " | TOPICAL_OINTMENT | RECTAL | Status: DC | PRN

## 2016-07-05 MED ORDER — SIMETHICONE 80 MG PO CHEW
80.0000 mg | CHEWABLE_TABLET | ORAL | Status: DC
  Administered 2016-07-06 – 2016-07-07 (×2): 80 mg via ORAL
  Filled 2016-07-05 (×2): qty 1

## 2016-07-05 MED ORDER — OXYCODONE HCL 5 MG PO TABS
10.0000 mg | ORAL_TABLET | ORAL | Status: DC | PRN
  Administered 2016-07-05 – 2016-07-07 (×7): 10 mg via ORAL
  Filled 2016-07-05 (×8): qty 2

## 2016-07-05 MED ORDER — ONDANSETRON HCL 4 MG/2ML IJ SOLN: 4.0000 mg | Freq: Three times a day (TID) | INTRAMUSCULAR | Status: DC | PRN

## 2016-07-05 MED ORDER — METHYLERGONOVINE MALEATE 0.2 MG/ML IJ SOLN: 0.2000 mg | INTRAMUSCULAR | Status: DC | PRN

## 2016-07-05 MED ORDER — MORPHINE SULFATE-NACL 0.5-0.9 MG/ML-% IV SOSY
PREFILLED_SYRINGE | INTRAVENOUS | Status: AC
  Filled 2016-07-05: qty 1

## 2016-07-05 MED ORDER — OXYCODONE HCL 5 MG PO TABS
5.0000 mg | ORAL_TABLET | ORAL | Status: DC | PRN
  Administered 2016-07-06: 5 mg via ORAL
  Filled 2016-07-05: qty 1

## 2016-07-05 MED ORDER — DEXAMETHASONE SODIUM PHOSPHATE 4 MG/ML IJ SOLN
INTRAMUSCULAR | Status: DC | PRN
  Administered 2016-07-05: 4 mg via INTRAVENOUS

## 2016-07-05 MED ORDER — PRENATAL MULTIVITAMIN CH
1.0000 | ORAL_TABLET | Freq: Every day | ORAL | Status: DC
  Administered 2016-07-06 – 2016-07-07 (×2): 1 via ORAL
  Filled 2016-07-05 (×2): qty 1

## 2016-07-05 MED ORDER — ONDANSETRON HCL 4 MG/2ML IJ SOLN
INTRAMUSCULAR | Status: AC
  Filled 2016-07-05: qty 2

## 2016-07-05 MED ORDER — ZOLPIDEM TARTRATE 5 MG PO TABS: 5.0000 mg | ORAL_TABLET | Freq: Every evening | ORAL | Status: DC | PRN

## 2016-07-05 MED ORDER — SCOPOLAMINE 1 MG/3DAYS TD PT72
MEDICATED_PATCH | TRANSDERMAL | Status: AC
  Administered 2016-07-05: 1.5 mg via TRANSDERMAL
  Filled 2016-07-05: qty 1

## 2016-07-05 MED ORDER — IBUPROFEN 600 MG PO TABS
600.0000 mg | ORAL_TABLET | Freq: Four times a day (QID) | ORAL | Status: DC
  Administered 2016-07-05 – 2016-07-07 (×7): 600 mg via ORAL
  Filled 2016-07-05 (×7): qty 1

## 2016-07-05 MED ORDER — ACETAMINOPHEN 325 MG PO TABS
650.0000 mg | ORAL_TABLET | ORAL | Status: DC | PRN
  Administered 2016-07-07: 650 mg via ORAL
  Filled 2016-07-05: qty 2

## 2016-07-05 MED ORDER — DIPHENHYDRAMINE HCL 50 MG/ML IJ SOLN: 12.5000 mg | INTRAMUSCULAR | Status: DC | PRN

## 2016-07-05 MED ORDER — ACETAMINOPHEN 160 MG/5ML PO SOLN
ORAL | Status: AC
  Administered 2016-07-05: 975 mg via ORAL
  Filled 2016-07-05: qty 40.6

## 2016-07-05 MED ORDER — NALOXONE HCL 0.4 MG/ML IJ SOLN
0.4000 mg | INTRAMUSCULAR | Status: DC | PRN
Start: 2016-07-05 — End: 2016-07-07

## 2016-07-05 MED ORDER — LIDOCAINE HCL 1 % IJ SOLN
INTRAMUSCULAR | Status: AC
  Filled 2016-07-05: qty 20

## 2016-07-05 MED ORDER — CEFAZOLIN SODIUM-DEXTROSE 2-4 GM/100ML-% IV SOLN
2.0000 g | INTRAVENOUS | Status: AC
  Administered 2016-07-05: 2 g via INTRAVENOUS

## 2016-07-05 MED ORDER — NALBUPHINE HCL 10 MG/ML IJ SOLN
5.0000 mg | Freq: Once | INTRAMUSCULAR | Status: AC | PRN
  Administered 2016-07-05: 5 mg via INTRAVENOUS

## 2016-07-05 MED ORDER — SCOPOLAMINE 1 MG/3DAYS TD PT72
1.0000 | MEDICATED_PATCH | Freq: Once | TRANSDERMAL | Status: DC
  Administered 2016-07-05: 1.5 mg via TRANSDERMAL

## 2016-07-05 MED ORDER — WITCH HAZEL-GLYCERIN EX PADS: 1.0000 "application " | MEDICATED_PAD | CUTANEOUS | Status: DC | PRN

## 2016-07-05 MED ORDER — SIMETHICONE 80 MG PO CHEW
80.0000 mg | CHEWABLE_TABLET | Freq: Three times a day (TID) | ORAL | Status: DC
  Administered 2016-07-05 – 2016-07-07 (×6): 80 mg via ORAL
  Filled 2016-07-05 (×6): qty 1

## 2016-07-05 MED ORDER — KETOROLAC TROMETHAMINE 30 MG/ML IJ SOLN
INTRAMUSCULAR | Status: AC
  Administered 2016-07-05: 30 mg via INTRAMUSCULAR
  Filled 2016-07-05: qty 1

## 2016-07-05 MED ORDER — NALBUPHINE HCL 10 MG/ML IJ SOLN
INTRAMUSCULAR | Status: AC
  Filled 2016-07-05: qty 1

## 2016-07-05 MED ORDER — SOD CITRATE-CITRIC ACID 500-334 MG/5ML PO SOLN
ORAL | Status: AC
  Filled 2016-07-05: qty 15

## 2016-07-05 MED ORDER — MEPERIDINE HCL 25 MG/ML IJ SOLN: 6.2500 mg | INTRAMUSCULAR | Status: DC | PRN

## 2016-07-05 MED ORDER — PHENYLEPHRINE 8 MG IN D5W 100 ML (0.08MG/ML) PREMIX OPTIME
INJECTION | INTRAVENOUS | Status: DC | PRN
  Administered 2016-07-05: 40 ug/min via INTRAVENOUS
  Administered 2016-07-05: 60 ug/min via INTRAVENOUS

## 2016-07-05 MED ORDER — LACTATED RINGERS IV SOLN
Freq: Once | INTRAVENOUS | Status: AC
  Administered 2016-07-05: 07:00:00 via INTRAVENOUS

## 2016-07-05 MED ORDER — OXYTOCIN 10 UNIT/ML IJ SOLN
INTRAMUSCULAR | Status: DC | PRN
  Administered 2016-07-05: 40 [IU] via INTRAVENOUS

## 2016-07-05 MED ORDER — SODIUM CHLORIDE 0.9% FLUSH: 3.0000 mL | INTRAVENOUS | Status: DC | PRN

## 2016-07-05 MED ORDER — HYDROMORPHONE HCL 1 MG/ML IJ SOLN
0.2500 mg | INTRAMUSCULAR | Status: DC | PRN
  Administered 2016-07-05: 0.25 mg via INTRAVENOUS
  Administered 2016-07-05: 0.5 mg via INTRAVENOUS

## 2016-07-05 MED ORDER — OXYTOCIN 10 UNIT/ML IJ SOLN
INTRAMUSCULAR | Status: AC
  Filled 2016-07-05: qty 4

## 2016-07-05 MED ORDER — ACETAMINOPHEN 160 MG/5ML PO SOLN
975.0000 mg | Freq: Once | ORAL | Status: AC
  Administered 2016-07-05: 975 mg via ORAL

## 2016-07-05 MED ORDER — SCOPOLAMINE 1 MG/3DAYS TD PT72
1.0000 | MEDICATED_PATCH | Freq: Once | TRANSDERMAL | Status: DC
  Filled 2016-07-05: qty 1

## 2016-07-05 MED ORDER — PHENYLEPHRINE 8 MG IN D5W 100 ML (0.08MG/ML) PREMIX OPTIME
INJECTION | INTRAVENOUS | Status: AC
  Filled 2016-07-05: qty 100

## 2016-07-05 MED ORDER — MORPHINE SULFATE (PF) 0.5 MG/ML IJ SOLN
INTRAMUSCULAR | Status: DC | PRN
  Administered 2016-07-05: .1 mg via EPIDURAL

## 2016-07-05 MED ORDER — FERROUS SULFATE 325 (65 FE) MG PO TABS
325.0000 mg | ORAL_TABLET | Freq: Two times a day (BID) | ORAL | Status: DC
  Administered 2016-07-05 – 2016-07-07 (×3): 325 mg via ORAL
  Filled 2016-07-05 (×4): qty 1

## 2016-07-05 MED ORDER — ONDANSETRON HCL 4 MG/2ML IJ SOLN
INTRAMUSCULAR | Status: DC | PRN
  Administered 2016-07-05: 4 mg via INTRAVENOUS

## 2016-07-05 MED ORDER — COCONUT OIL OIL
1.0000 "application " | TOPICAL_OIL | Status: DC | PRN
  Filled 2016-07-05: qty 120

## 2016-07-05 MED ORDER — NALBUPHINE HCL 10 MG/ML IJ SOLN: 5.0000 mg | Freq: Once | INTRAMUSCULAR | Status: AC | PRN

## 2016-07-05 MED ORDER — HYDROMORPHONE HCL 1 MG/ML IJ SOLN
INTRAMUSCULAR | Status: AC
  Administered 2016-07-05: 0.5 mg via INTRAVENOUS
  Filled 2016-07-05: qty 1

## 2016-07-05 MED ORDER — FENTANYL CITRATE (PF) 100 MCG/2ML IJ SOLN
INTRAMUSCULAR | Status: DC | PRN
  Administered 2016-07-05: 25 ug via INTRAVENOUS

## 2016-07-05 MED ORDER — LACTATED RINGERS IV SOLN
INTRAVENOUS | Status: DC | PRN
  Administered 2016-07-05: 08:00:00 via INTRAVENOUS

## 2016-07-05 MED ORDER — MENTHOL 3 MG MT LOZG: 1.0000 | LOZENGE | OROMUCOSAL | Status: DC | PRN

## 2016-07-05 MED ORDER — NALOXONE HCL 2 MG/2ML IJ SOSY
1.0000 ug/kg/h | PREFILLED_SYRINGE | INTRAVENOUS | Status: DC | PRN
  Filled 2016-07-05: qty 2

## 2016-07-05 MED ORDER — DIPHENHYDRAMINE HCL 25 MG PO CAPS: 25.0000 mg | ORAL_CAPSULE | ORAL | Status: DC | PRN

## 2016-07-05 MED ORDER — FENTANYL CITRATE (PF) 100 MCG/2ML IJ SOLN
INTRAMUSCULAR | Status: AC
  Filled 2016-07-05: qty 2

## 2016-07-05 MED ORDER — LACTATED RINGERS IV SOLN
INTRAVENOUS | Status: DC
  Administered 2016-07-05: 20:00:00 via INTRAVENOUS

## 2016-07-05 MED ORDER — OXYTOCIN 40 UNITS IN LACTATED RINGERS INFUSION - SIMPLE MED: 2.5000 [IU]/h | INTRAVENOUS | Status: AC

## 2016-07-05 MED ORDER — SIMETHICONE 80 MG PO CHEW: 80.0000 mg | CHEWABLE_TABLET | ORAL | Status: DC | PRN

## 2016-07-05 MED ORDER — DIPHENHYDRAMINE HCL 25 MG PO CAPS: 25.0000 mg | ORAL_CAPSULE | Freq: Four times a day (QID) | ORAL | Status: DC | PRN

## 2016-07-05 MED ORDER — LACTATED RINGERS IV SOLN
INTRAVENOUS | Status: DC
  Administered 2016-07-05 (×2): via INTRAVENOUS

## 2016-07-05 SURGICAL SUPPLY — 41 items
BARRIER ADHS 3X4 INTERCEED (GAUZE/BANDAGES/DRESSINGS) ×3 IMPLANT
BENZOIN TINCTURE PRP APPL 2/3 (GAUZE/BANDAGES/DRESSINGS) ×3 IMPLANT
CHLORAPREP W/TINT 26ML (MISCELLANEOUS) ×3 IMPLANT
CLAMP CORD UMBIL (MISCELLANEOUS) IMPLANT
CLOSURE STERI STRIP 1/2 X4 (GAUZE/BANDAGES/DRESSINGS) ×2 IMPLANT
CLOSURE WOUND 1/2 X4 (GAUZE/BANDAGES/DRESSINGS) ×1
CLOTH BEACON ORANGE TIMEOUT ST (SAFETY) ×3 IMPLANT
CONTAINER PREFILL 10% NBF 15ML (MISCELLANEOUS) IMPLANT
DRSG OPSITE POSTOP 4X10 (GAUZE/BANDAGES/DRESSINGS) ×3 IMPLANT
ELECT REM PT RETURN 9FT ADLT (ELECTROSURGICAL) ×3
ELECTRODE REM PT RTRN 9FT ADLT (ELECTROSURGICAL) ×1 IMPLANT
EXTRACTOR VACUUM KIWI (MISCELLANEOUS) IMPLANT
GLOVE BIOGEL M 6.5 STRL (GLOVE) ×6 IMPLANT
GLOVE BIOGEL PI IND STRL 6.5 (GLOVE) ×1 IMPLANT
GLOVE BIOGEL PI IND STRL 7.0 (GLOVE) ×1 IMPLANT
GLOVE BIOGEL PI INDICATOR 6.5 (GLOVE) ×2
GLOVE BIOGEL PI INDICATOR 7.0 (GLOVE) ×2
GOWN STRL REUS W/TWL LRG LVL3 (GOWN DISPOSABLE) ×9 IMPLANT
HEMOSTAT ARISTA ABSORB 3G PWDR (MISCELLANEOUS) ×3 IMPLANT
KIT ABG SYR 3ML LUER SLIP (SYRINGE) IMPLANT
NEEDLE HYPO 25X5/8 SAFETYGLIDE (NEEDLE) IMPLANT
NS IRRIG 1000ML POUR BTL (IV SOLUTION) ×3 IMPLANT
PACK C SECTION WH (CUSTOM PROCEDURE TRAY) ×3 IMPLANT
PAD OB MATERNITY 4.3X12.25 (PERSONAL CARE ITEMS) ×3 IMPLANT
PENCIL SMOKE EVAC W/HOLSTER (ELECTROSURGICAL) ×3 IMPLANT
RTRCTR C-SECT PINK 25CM LRG (MISCELLANEOUS) IMPLANT
SPONGE LAP 18X18 X RAY DECT (DISPOSABLE) ×9 IMPLANT
STRIP CLOSURE SKIN 1/2X4 (GAUZE/BANDAGES/DRESSINGS) ×2 IMPLANT
SUT PDS AB 0 CT1 27 (SUTURE) ×6 IMPLANT
SUT PLAIN 0 NONE (SUTURE) IMPLANT
SUT VIC AB 0 CT1 36 (SUTURE) ×3 IMPLANT
SUT VIC AB 0 CTX 36 (SUTURE) ×10
SUT VIC AB 0 CTX36XBRD ANBCTRL (SUTURE) ×5 IMPLANT
SUT VIC AB 2-0 CT1 27 (SUTURE) ×2
SUT VIC AB 2-0 CT1 TAPERPNT 27 (SUTURE) ×1 IMPLANT
SUT VIC AB 3-0 SH 27 (SUTURE)
SUT VIC AB 3-0 SH 27X BRD (SUTURE) IMPLANT
SUT VIC AB 4-0 KS 27 (SUTURE) ×3 IMPLANT
SUT VICRYL 0 TIES 12 18 (SUTURE) ×3 IMPLANT
TOWEL OR 17X24 6PK STRL BLUE (TOWEL DISPOSABLE) ×3 IMPLANT
TRAY FOLEY CATH SILVER 14FR (SET/KITS/TRAYS/PACK) ×3 IMPLANT

## 2016-07-05 NOTE — Anesthesia Procedure Notes (Signed)
Spinal  Patient location during procedure: OR Staffing Anesthesiologist: Bruchy Mikel Preanesthetic Checklist Completed: patient identified, site marked, surgical consent, pre-op evaluation, timeout performed, IV checked, risks and benefits discussed and monitors and equipment checked Spinal Block Patient position: sitting Prep: DuraPrep Patient monitoring: heart rate, cardiac monitor, continuous pulse ox and blood pressure Approach: midline Location: L3-4 Injection technique: single-shot Needle Needle type: Sprotte  Needle gauge: 24 G Needle length: 9 cm Assessment Sensory level: T4 Additional Notes Spinal Dosage in OR  Bupivicaine ml       1.4 PFMS04   mcg        100 Fentanyl mcg            25      

## 2016-07-05 NOTE — Lactation Note (Signed)
This note was copied from a baby's chart. Lactation Consultation Note  Patient Name: Boy Jan Firemanshley Laughery QMVHQ'IToday's Date: 07/05/2016 Reason for consult: Initial assessment   With this second time mom and term baby, now 7 hours old.Mom had a C-section.  The baby was returning form his hearing screen, and was asleep, fed about 1 1/2 hours prior to my consult. Mom reports her first baby was only breast fed for a few weeks, due to severe nipple biting and pain. This baby is latching well, good breast movement, and no discomfort for mom. Baby has had more than adequate wet and dirty diapers already.  Basic breast feeding teaching done with mom, and lactation services also reviewed. Mom encouraged to keep baby skin to skin as much as possible, to increase appetite, and increase her milk supply. Mom is already feeding with cues, and knows  A newborn will feed about 8-12 times a day. Normal # of wet and dirty diapers reviewed. Dad present and very involved , and both parents very receptive to my teaching. Mom knows to call for her nurse or lactation for help as needed.    Maternal Data Formula Feeding for Exclusion: No Has patient been taught Hand Expression?: Yes Does the patient have breastfeeding experience prior to this delivery?: Yes  Feeding Feeding Type: Breast Fed Length of feed: 17 min  LATCH Score/Interventions    Audible Swallowing:  (small drops of colostrum with hand expression)  Type of Nipple: Everted at rest and after stimulation  Comfort (Breast/Nipple): Soft / non-tender     Intervention(s): Breastfeeding basics reviewed;Support Pillows;Position options;Skin to skin     Lactation Tools Discussed/Used     Consult Status Consult Status: Follow-up Date: 07/06/16 Follow-up type: In-patient    Alfred LevinsLee, Layloni Fahrner Anne 07/05/2016, 3:40 PM

## 2016-07-05 NOTE — Transfer of Care (Signed)
Immediate Anesthesia Transfer of Care Note  Patient: Megan Skinner  Procedure(s) Performed: Procedure(s): CESAREAN SECTION (N/A)  Patient Location: PACU  Anesthesia Type:Spinal  Level of Consciousness: awake  Airway & Oxygen Therapy: Patient Spontanous Breathing  Post-op Assessment: Report given to RN  Post vital signs: Reviewed  Last Vitals:  Vitals:   07/05/16 0623  BP: 118/68  Pulse: 79  Resp: 18  Temp: 36.7 C    Last Pain:  Vitals:   07/05/16 0623  TempSrc: Oral      Patients Stated Pain Goal: 3 (07/05/16 16100623)  Complications: No apparent anesthesia complications

## 2016-07-05 NOTE — H&P (Signed)
Date of Initial H&P: 05/23/17  History reviewed, patient examined, no change in status, stable for surgery. 

## 2016-07-05 NOTE — Anesthesia Postprocedure Evaluation (Signed)
Anesthesia Post Note  Patient: Megan Skinner  Procedure(s) Performed: Procedure(s) (LRB): CESAREAN SECTION (N/A)  Patient location during evaluation: Mother Baby Anesthesia Type: Spinal Level of consciousness: awake Pain management: satisfactory to patient Vital Signs Assessment: post-procedure vital signs reviewed and stable Respiratory status: spontaneous breathing Cardiovascular status: stable Anesthetic complications: no        Last Vitals:  Vitals:   07/05/16 1025 07/05/16 1130  BP: 118/72 113/63  Pulse: (!) 55 (!) 59  Resp: 16 18  Temp: 36.6 C 36.8 C    Last Pain:  Vitals:   07/05/16 1130  TempSrc: Axillary  PainSc:    Pain Goal: Patients Stated Pain Goal: 3 (07/05/16 [id number])               Cephus ShellingBURGER,Megan Skinner

## 2016-07-05 NOTE — Anesthesia Postprocedure Evaluation (Signed)
Anesthesia Post Note  Patient: Megan Skinner  Procedure(s) Performed: Procedure(s) (LRB): CESAREAN SECTION (N/A)  Patient location during evaluation: PACU Anesthesia Type: Spinal Level of consciousness: awake Pain management: satisfactory to patient Vital Signs Assessment: post-procedure vital signs reviewed and stable Respiratory status: spontaneous breathing Cardiovascular status: blood pressure returned to baseline Postop Assessment: no headache and spinal receding Anesthetic complications: no        Last Vitals:  Vitals:   07/05/16 0930 07/05/16 0945  BP: 103/64 101/65  Pulse: 68 (!) 58  Resp: 13 14  Temp:      Last Pain:  Vitals:   07/05/16 1000  TempSrc:   PainSc: (P) 7    Pain Goal: Patients Stated Pain Goal: 3 (07/05/16 16100623)               Jiles GarterJACKSON,Ramello Cordial EDWARD

## 2016-07-05 NOTE — Addendum Note (Signed)
Addendum  created 07/05/16 1517 by Algis GreenhouseLinda A Brae Schaafsma, CRNA   Sign clinical note

## 2016-07-05 NOTE — Op Note (Signed)
Cesarean Section Procedure Note  Indications: previous uterine incision low transverse cesarean section   Pre-operative Diagnosis: 39 week 0 day pregnancy.  Post-operative Diagnosis: same  Surgeon: Dr.  Jessee AversOLE,Lucio Litsey J.   Assistants: Dr. Jorene GuestEvenlyn Varnado   Anesthesia: Spinal anesthesia  ASA Class: 2   Procedure Details   The patient was seen in the Holding Room. The risks, benefits, complications, treatment options, and expected outcomes were discussed with the patient.  The patient concurred with the proposed plan, giving informed consent.  The site of surgery properly noted/marked. The patient was taken to Operating Room # 9, identified as Megan Skinner and the procedure verified as C-Section Delivery. A Time Out was held and the above information confirmed.  After induction of anesthesia, the patient was draped and prepped in the usual sterile manner. A Pfannenstiel incision was made and carried down through the subcutaneous tissue to the fascia. Fascial incision was made and extended transversely. The fascia was separated from the underlying rectus tissue superiorly and inferiorly. The peritoneum was identified and entered. Peritoneal incision was extended longitudinally.  Pt was noted to have adhesions of the bladder to the lower uterine segment. There was also a dense adhesion of the peritoneum to the anterior uterine wall. This was clamped with kelley clamps excised and suture ligated with 0 vicryl. The utero-vesical peritoneal reflection was incised transversely and the bladder flap was bluntly freed from the lower uterine segment. A low transverse uterine incision was made. Delivered from cephalic presentation was a Female with Apgar scores of 8 at one minute and 9 at five minutes. After the umbilical cord was clamped and cut cord blood was obtained for evaluation. The placenta was removed intact and appeared normal. The uterine outline, tubes and ovaries appeared normal. The uterine  incision was closed with running locked sutures of 0 vicryl. A second layer of suture was used to imbricate the incision.  pt was noted to have continued bleeding from the uterine incision. Interupted figure of eight suture was used to control the bleeding.  Hemostasis was observed. Lavage was carried out until clear.  Arista was placed along the uterine incision. Interseed was placed . The fascia was then reapproximated with running sutures of 0 pds. The skin was reapproximated with 4-0 vicryl .  Instrument, sponge, and needle counts were correct prior the abdominal closure and at the conclusion of the case.   Findings: Female infant in the cephalic presentation. Normal fallopian tubes and ovaries  Dense adhesion of the bladder to the lower uterine segment. Dense adhesion of the peritoneum to the anterior abdominal wall   Estimated Blood Loss:  900 mL         Drains: None         Total IV Fluids:  Per anestheisa ml         Specimens: Placenta and sent to labor and delivery            Implants: None         Complications:  None; patient tolerated the procedure well.         Disposition: PACU - hemodynamically stable.         Condition: stable  Attending Attestation: I performed the procedure.

## 2016-07-06 LAB — CBC
HCT: 24.6 % — ABNORMAL LOW (ref 36.0–46.0)
Hemoglobin: 8.2 g/dL — ABNORMAL LOW (ref 12.0–15.0)
MCH: 22.9 pg — AB (ref 26.0–34.0)
MCHC: 33.3 g/dL (ref 30.0–36.0)
MCV: 68.7 fL — AB (ref 78.0–100.0)
Platelets: 142 10*3/uL — ABNORMAL LOW (ref 150–400)
RBC: 3.58 MIL/uL — ABNORMAL LOW (ref 3.87–5.11)
RDW: 15.7 % — ABNORMAL HIGH (ref 11.5–15.5)
WBC: 8.9 10*3/uL (ref 4.0–10.5)

## 2016-07-06 LAB — BIRTH TISSUE RECOVERY COLLECTION (PLACENTA DONATION)

## 2016-07-06 NOTE — Lactation Note (Signed)
This note was copied from a baby's chart. Lactation Consultation Note  Patient Name: Megan Skinner Reason for consult: Follow-up assessment;Difficult latch (right side)  Visited with Mom, baby 30 hrs old.   Mom states she is latching baby easily on left side, but baby hasn't latched on right side "since yesterday".  Baby had just fed on left breast for 20 minutes, and was giving subtle cue while being held by Dad.  Offered to assist with a latch onto right side.  Baby too sleepy to latch.  Positioned baby in cross cradle hold and laid back position.  Reviewed positioning and demonstrated hand expression.  Small drops of colostrum expressed.  Talked to Mom about using the DEBP on pumping to stimulate supply if unable to latch on right side.  Mom to ask for assistance when baby awakens and starts cueing to feed.  Talked with RN about setting up pump after next assist if baby unable to latch onto right side.  Right breast is compressible and nipple erect.  Reassured Mom and encouraged her to keep baby STS and call for assistance as needed.   Consult Status Consult Status: Follow-up Date: 07/07/16 Follow-up type: In-patient    Megan Skinner, Megan Skinner Skinner, 2:55 PM

## 2016-07-06 NOTE — Progress Notes (Signed)
Subjective: Postpartum Day 1: Cesarean Delivery Patient reports tolerating PO and + flatus.  Patient has not voided yet   Objective: Vital signs in last 24 hours: Temp:  [97.8 F (36.6 C)-98.6 F (37 C)] 98.2 F (36.8 C) (01/03 0541) Pulse Rate:  [59-68] 64 (01/03 0541) Resp:  [16-18] 18 (01/03 0541) BP: (101-108)/(44-56) 103/56 (01/03 0541) SpO2:  [97 %-99 %] 97 % (01/03 0200)  Physical Exam:  General: alert and cooperative Lochia: appropriate Uterine Fundus: firm Incision: healing well DVT Evaluation: No evidence of DVT seen on physical exam.   Recent Labs  07/04/16 1210 07/06/16 0511  HGB 10.3* 8.2*  HCT 32.0* 24.6*    Assessment/Plan: Status post Cesarean section. Doing well postoperatively.  Awaiting return of bowel function. If no void in 4 hours in and out catheterize.  Pt interested in early discharge home tomorrow if infant is doing well.  Pt desires circumcision of infant prior to discharge. R/B/A of circumcision discussed .  Megan Skinner J. 07/06/2016, 1:13 PM

## 2016-07-06 NOTE — Plan of Care (Signed)
Problem: Urinary Elimination: Goal: Ability to reestablish a normal urinary elimination pattern will improve Outcome: Completed/Met Date Met: 07/06/16 Patient is voiding well since I & O cath this am.

## 2016-07-07 ENCOUNTER — Encounter (HOSPITAL_COMMUNITY): Payer: Self-pay | Admitting: *Deleted

## 2016-07-07 MED ORDER — IBUPROFEN 600 MG PO TABS: 600.0000 mg | ORAL_TABLET | Freq: Four times a day (QID) | ORAL | 0 refills | Status: DC

## 2016-07-07 MED ORDER — DOCUSATE SODIUM 100 MG PO CAPS: 100.0000 mg | ORAL_CAPSULE | Freq: Two times a day (BID) | ORAL | 0 refills | Status: DC

## 2016-07-07 MED ORDER — OXYCODONE-ACETAMINOPHEN 5-325 MG PO TABS: 1.0000 | ORAL_TABLET | Freq: Four times a day (QID) | ORAL | 0 refills | Status: DC | PRN

## 2016-07-07 MED ORDER — FERROUS SULFATE 325 (65 FE) MG PO TABS: 325.0000 mg | ORAL_TABLET | Freq: Every day | ORAL | 3 refills | Status: DC

## 2016-07-07 NOTE — Lactation Note (Addendum)
This note was copied from a baby's chart. Lactation Consultation Note Mom having difficulty latching baby to Rt. Nipple. Has been latching to Lt. Nipple, now unable to latch to Lt. Nipple now. Breast are filling, cone shaped , areola filling, everted short shaft nipple less compressible. Knots noted to breast up to axillary. Breast massage, hand expressed 4ml. Discussed pumping, engorgement prevention and management. Mom shown how to use DEBP & how to disassemble, clean, & reassemble parts. Mom knows to pump q3h for 15-20 min. ICE given. Mom pumped 5ml, lost some from flange. Laid mom back and ICED to soften breast and mom to rest until next feeding.  Mom encouraged to feed baby 8-12 times/24 hours and with feeding cues. Mom is to BF, then pump to relieve breast. Has ICE if needed.  Fitted mom w/#16 NS. Application taught w/mom return demonstration. Taught "C" hold for latching. Encouraged to call for assistance if needed. Shells given for short shaft nipples and to soften areola tissue. Mom has DEBP at home and a hand pump.   Baby had 8% weight loss in 39 hrs. 8 voids, 4 stools. LC feels that baby will be able to transfer more colostrum w/NS. I&O encouraged to cont.  Patient Name: Megan Skinner Reason for consult: Follow-up assessment;Breast/nipple pain;Difficult latch   Maternal Data    Feeding Feeding Type: Breast Fed Length of feed: 15 min  LATCH Score/Interventions Latch: Repeated attempts needed to sustain latch, nipple held in mouth throughout feeding, stimulation needed to elicit sucking reflex. Intervention(s): Skin to skin Intervention(s): Breast massage;Breast compression  Audible Swallowing: A few with stimulation Intervention(s): Hand expression Intervention(s): Alternate breast massage  Type of Nipple: Everted at rest and after stimulation (short shaft)  Comfort (Breast/Nipple): Filling, red/small blisters or bruises, mild/mod  discomfort  Problem noted: Filling Interventions (Filling): Massage;Frequent nursing;Double electric pump;Hand pump Interventions (Mild/moderate discomfort): Breast shields;Post-pump;Hand expression;Hand massage  Hold (Positioning): Assistance needed to correctly position infant at breast and maintain latch. Intervention(s): Breastfeeding basics reviewed;Support Pillows;Position options;Skin to skin  LATCH Score: 7  Lactation Tools Discussed/Used Tools: Shells;Nipple Dorris CarnesShields;Pump Nipple shield size: 16 Shell Type: Inverted Breast pump type: Double-Electric Breast Pump Pump Review: Setup, frequency, and cleaning;Milk Storage Initiated by:: Peri JeffersonL. Allix Blomquist RN IBCLC Date initiated:: 07/07/16   Consult Status Consult Status: Follow-up Date: 07/07/16 Follow-up type: In-patient    Megan Skinner, Megan Skinner, Megan Skinner

## 2016-07-07 NOTE — Progress Notes (Signed)
Subjective: Postpartum Day 2: Cesarean Delivery Pt resting comfortably in bed, no acute complaints. Patient reports tolerating PO and + flatus.  Voiding freely and ambulating without difficulty.  Pain well controlled.  Doing better with breastfeeding now that she has seen lactation.   Objective: Vital signs in last 24 hours: Temp:  [98.2 F (36.8 C)] 98.2 F (36.8 C) (01/04 0528) Pulse Rate:  [79] 79 (01/04 0528) Resp:  [18] 18 (01/04 0528) BP: (110)/(60) 110/60 (01/04 0528)  Physical Exam:  General: alert and cooperative  CV: RRR Lungs: CTAB Abd: soft, +BS, uterus firm, non-tender, below umbilcus Incision: healing well, bandage Clean/dry DVT Evaluation: No evidence of DVT seen on physical exam. No edema present   Recent Labs  07/04/16 1210 07/06/16 0511  HGB 10.3* 8.2*  HCT 32.0* 24.6*    Assessment/Plan: 25yo G2P2 s/p repeat C-section  -Doing well postoperatively.  -continue iron daily, pt asymptomatic -Baby boy circ completed on 1/3 by Dr. Richardson Doppole -Plan for discharge home today if ok with PEDS  Myna HidalgoZAN, Alecia Doi, M 07/07/2016, 7:46 AM

## 2016-07-07 NOTE — Lactation Note (Signed)
This note was copied from a baby's chart. Lactation Consultation Note  Patient Name: Megan Skinner IEPPI'RToday's Date: 07/07/2016 Reason for consult: Follow-up assessment  Mom's milk is mostly in, but still likely more to come. Megan Skinner was observed nursing very well on both breasts (without the nipple shield). Mom's nipple shape was not distorted when he released the latch on the L breast. I assisted him in latching on the R breast, which he did mostly on his own. Specifics of an asymmetric latch shown via [employer] website animation as well as some tips on hand placement. Parents' questions answered. Mom is not engorged at this time, but a hand-out on engorgement was given, just in case. Mom had issues w/engorgement with her 1st child, but they had also used a pacifier. They do not plan to use a pacifier w/Kameron.   Outpatient LC appt (1st available) made for Jan 10th at 1600.   Megan Skinner, [facility] 07/07/2016, 11:12 AM

## 2016-07-07 NOTE — Discharge Instructions (Signed)
Cesarean Delivery, Care After Refer to this sheet in the next few weeks. These instructions provide you with information about caring for yourself after your procedure. Your health care provider may also give you more specific instructions. Your treatment has been planned according to current medical practices, but problems sometimes occur. Call your health care provider if you have any problems or questions after your procedure. What can I expect after the procedure? After the procedure, it is common to have:  A small amount of blood or clear fluid coming from the incision.  Some redness, swelling, and pain in your incision area.  Some abdominal pain and soreness.  Vaginal bleeding (lochia).  Pelvic cramps.  Fatigue. Follow these instructions at home: Incision care  Follow instructions from your health care provider about how to take care of your incision. Make sure you:  Wash your hands with soap and water before you change your bandage (dressing). If soap and water are not available, use hand sanitizer.  Change your dressing as told by your health care provider.  Leave stitches (sutures), skin staples, skin glue, or adhesive strips in place. These skin closures may need to stay in place for 2 weeks or longer. If adhesive strip edges start to loosen and curl up, you may trim the loose edges. Do not remove adhesive strips completely unless your health care provider tells you to do that.  Check your incision area every day for signs of infection. Check for:  More redness, swelling, or pain.  More fluid or blood.  Warmth.  Pus or a bad smell.  When you cough or sneeze, hug a pillow. This helps with pain and decreases the chance of your incision opening up (dehiscing). Do this until your incision heals. Medicines  Take over-the-counter and prescription medicines only as told by your health care provider.  For pain management you may switch between Ibuprofen and Percocet.   Percocet may cause constipation so be sure to take colace twice daily as needed.  Also if you don't feel like you need the percocet it is ok to take tylenol instead.  If you were prescribed an antibiotic medicine, take it as told by your health care provider. Do not stop taking the antibiotic until it is finished. Driving  Do not drive or operate heavy machinery while taking prescription pain medicine.  Do not drive for 24 hours if you received a sedative. Lifestyle  Do not drink alcohol. This is especially important if you are breastfeeding or taking pain medicine.  Do not use tobacco products, including cigarettes, chewing tobacco, or e-cigarettes. If you need help quitting, ask your health care provider. Tobacco can delay wound healing. Eating and drinking  Drink at least 8 eight-ounce glasses of water every day unless told not to by your health care provider. If you breastfeed, you may need to drink more water than this.  Eat high-fiber foods every day. These foods may help prevent or relieve constipation. High-fiber foods include:  Whole grain cereals and breads.  Brown rice.  Beans.  Fresh fruits and vegetables. Activity  Return to your normal activities as told by your health care provider. Ask your health care provider what activities are safe for you.  Rest as much as possible. Try to rest or take a nap while your baby is sleeping.  Do not lift anything that is heavier than your baby or 10 lb (4.5 kg) as told by your health care provider.  Ask your health care provider when you  can engage in sexual activity. This may depend on your:  Risk of infection.  Healing rate.  Comfort and desire to engage in sexual activity. Bathing  Do not take baths, swim, or use a hot tub until your health care provider approves. Ask your health care provider if you can take showers. You may only be allowed to take sponge baths until your incision heals.  Keep your dressing dry as told  by your health care provider. General instructions  Do not use tampons or douches until your health care provider approves.  Wear:  Loose, comfortable clothing.  A supportive and well-fitting bra.  Watch for any blood clots that may pass from your vagina. These may look like clumps of dark red, brown, or black discharge.  Keep your perineum clean and dry as told by your health care provider.  Wipe from front to back when you use the toilet.  If possible, have someone help you care for your baby and help with household activities for a few days after you leave the hospital.  Keep all follow-up visits for you and your baby as told by your health care provider. This is important. Contact a health care provider if:  You have:  Bad-smelling vaginal discharge.  Difficulty urinating.  Pain when urinating.  A sudden increase or decrease in the frequency of your bowel movements.  More redness, swelling, or pain around your incision.  More fluid or blood coming from your incision.  Pus or a bad smell coming from your incision.  A fever.  A rash.  Little or no interest in activities you used to enjoy.  Questions about caring for yourself or your baby.  Nausea.  Your incision feels warm to the touch.  Your breasts turn red or become painful or hard.  You feel unusually sad or worried.  You vomit.  You pass large blood clots from your vagina. If you pass a blood clot, save it to show to your health care provider. Do not flush blood clots down the toilet without showing your health care provider.  You urinate more than usual.  You are dizzy or light-headed.  You have not breastfed and have not had a menstrual period for 12 weeks after delivery.  You stopped breastfeeding and have not had a menstrual period for 12 weeks after stopping breastfeeding. Get help right away if:  You have:  Pain that does not go away or get better with medicine.  Chest  pain.  Difficulty breathing.  Blurred vision or spots in your vision.  Thoughts about hurting yourself or your baby.  New pain in your abdomen or in one of your legs.  A severe headache.  You faint.  You bleed from your vagina so much that you fill two sanitary pads in one hour. This information is not intended to replace advice given to you by your health care provider. Make sure you discuss any questions you have with your health care provider. Document Released: 03/12/2002 Document Revised: 10/29/2015 Document Reviewed: 05/25/2015 Elsevier Interactive Patient Education  2017 [employer].

## 2016-07-08 LAB — TYPE AND SCREEN
BLOOD PRODUCT EXPIRATION DATE: 201801172359
Blood Product Expiration Date: 201801172359
UNIT TYPE AND RH: 6200
Unit Type and Rh: 6200

## 2016-07-09 ENCOUNTER — Encounter (HOSPITAL_COMMUNITY): Payer: Self-pay | Admitting: Obstetrics and Gynecology

## 2016-07-09 NOTE — Discharge Summary (Signed)
OB Discharge Summary     Patient Name: Megan Skinner DOB: 10/27/1990 MRN: [id number]  Date of admission: 07/05/2016 Delivering MD: Gerald LeitzOLE, TARA   Date of discharge: 07/07/2016  Admitting diagnosis: O34.219 H-O Cesarean Section Intrauterine pregnancy: 6417w0d     Secondary diagnosis:  Active Problems:   S/P cesarean section  Additional problems: Intrauterine pregnancy @ 40wk, Prior C-section, desire for repeat     Discharge diagnosis: Term Pregnancy Delivered                                                                                                Post partum procedures:none  Augmentation: N/A  Complications: None  Hospital course:  Sceduled C/S   26 y.o. yo G2P2002 at 3217w0d was admitted to the hospital 07/05/2016 for scheduled cesarean section with the following indication:Elective Repeat.  Membrane Rupture Time/Date: 7:56 AM ,07/05/2016   Patient delivered a Viable infant.07/05/2016  Details of operation can be found in separate operative note.  Pateint had an uncomplicated postpartum course.  She is ambulating, tolerating a regular diet, passing flatus, and urinating well. Patient is discharged home in stable condition on  07/09/16          Physical exam Vitals:   07/06/16 0200 07/06/16 0541 07/06/16 2215 07/07/16 0528  BP: (!) 101/44 (!) 103/56  110/60  Pulse: 63 64  79  Resp: 18 18 18 18   Temp: 97.8 F (36.6 C) 98.2 F (36.8 C)  98.2 F (36.8 C)  TempSrc: Axillary Oral  Oral  SpO2: 97%      General: alert, cooperative and no distress Lochia: appropriate Uterine Fundus: firm Incision: Healing well with no significant drainage DVT Evaluation: No evidence of DVT seen on physical exam. Labs: Lab Results  Component Value Date   WBC 8.9 07/06/2016   HGB 8.2 (L) 07/06/2016   HCT 24.6 (L) 07/06/2016   MCV 68.7 (L) 07/06/2016   PLT 142 (L) 07/06/2016   CMP Latest Ref Rng & Units 08/19/2014  Glucose 70 - 99 mg/dL 409(W112(H)  BUN 6 - 23 mg/dL 11  Creatinine 1.190.50 - 1.471.10  mg/dL 8.290.89  Sodium 562135 - 130145 mmol/L 136  Potassium 3.5 - 5.1 mmol/L 3.6  Chloride 96 - 112 mmol/L 105  CO2 19 - 32 mmol/L 24  Calcium 8.4 - 10.5 mg/dL 8.6  Total Protein 6.0 - 8.3 g/dL 7.3  Total Bilirubin 0.3 - 1.2 mg/dL 8.6(V1.3(H)  Alkaline Phos 39 - 117 U/L 46  AST 0 - 37 U/L 22  ALT 0 - 35 U/L 14    Discharge instruction: per After Visit Summary and "Baby and Me Booklet".  After visit meds:  Allergies as of 07/07/2016   No Known Allergies     Medication List    TAKE these medications   docusate sodium 100 MG capsule Commonly known as:  COLACE Take 1 capsule (100 mg total) by mouth 2 (two) times daily.   ferrous sulfate 325 (65 FE) MG tablet Take 1 tablet (325 mg total) by mouth daily with breakfast.   ibuprofen 600 MG tablet Commonly known as:  ADVIL,MOTRIN  Take 1 tablet (600 mg total) by mouth every 6 (six) hours.   oxyCODONE-acetaminophen 5-325 MG tablet Commonly known as:  ROXICET Take 1 tablet by mouth every 6 (six) hours as needed for severe pain.   prenatal multivitamin Tabs tablet Take 1 tablet by mouth daily.       Diet: routine diet  Activity: Advance as tolerated. Pelvic rest for 6 weeks.   Outpatient follow up:2 weeks Follow up Appt:Future Appointments Date Time Provider Department Center  07/13/2016 4:00 PM [facility] None   Follow up Visit:No Follow-up on file.  Postpartum contraception: Not Discussed  Newborn Data: Live born female  Birth Weight: 8 lb 5.7 oz (3790 g) APGAR: 8, 9  Baby Feeding: Breast Disposition:home with mother   07/09/2016 Myna Hidalgo, M, DO

## 2016-07-13 ENCOUNTER — Ambulatory Visit (HOSPITAL_COMMUNITY): Admit: 2016-07-13 | Payer: Managed Care, Other (non HMO)

## 2016-10-21 ENCOUNTER — Ambulatory Visit: Payer: Self-pay

## 2017-08-02 NOTE — Progress Notes (Deleted)
No chief complaint on file.   HPI  4 review of systems  Past Medical History:  Diagnosis Date  . Medical history non-contributory     Current Outpatient Medications  Medication Sig Dispense Refill  . docusate sodium (COLACE) 100 MG capsule Take 1 capsule (100 mg total) by mouth 2 (two) times daily. 30 capsule 0  . ferrous sulfate 325 (65 FE) MG tablet Take 1 tablet (325 mg total) by mouth daily with breakfast. 30 tablet 3  . ibuprofen (ADVIL,MOTRIN) 600 MG tablet Take 1 tablet (600 mg total) by mouth every 6 (six) hours. 30 tablet 0  . oxyCODONE-acetaminophen (ROXICET) 5-325 MG tablet Take 1 tablet by mouth every 6 (six) hours as needed for severe pain. 15 tablet 0  . Prenatal Vit-Fe Fumarate-FA (PRENATAL MULTIVITAMIN) TABS tablet Take 1 tablet by mouth daily.      No current facility-administered medications for this visit.     Allergies: No Known Allergies  Past Surgical History:  Procedure Laterality Date  . ABDOMINAL HERNIA REPAIR  2001  . CESAREAN SECTION N/A 11/29/2012   Procedure: CESAREAN SECTION;  Surgeon: Bing Plumehomas F Henley, MD;  Location: [facility];  Service: Obstetrics;  Laterality: N/A;  . CESAREAN SECTION N/A 07/05/2016   Procedure: CESAREAN SECTION;  Surgeon: Gerald Leitzara Cole, MD;  [facility];  Service: Obstetrics;  Laterality: N/A;  . HERNIA REPAIR      Social History   Socioeconomic History  . Marital status: Married    Spouse name: Not on file  . Number of children: Not on file  . Years of education: Not on file  . Highest education level: Not on file  Social Needs  . Financial resource strain: Not on file  . Food insecurity - worry: Not on file  . Food insecurity - inability: Not on file  . Transportation needs - medical: Not on file  . Transportation needs - non-medical: Not on file  Occupational History  . Not on file  Tobacco Use  . Smoking status: Never Smoker  . Smokeless tobacco: Never Used  Substance and Sexual Activity  . Alcohol  use: No  . Drug use: No  . Sexual activity: Yes  Other Topics Concern  . Not on file  Social History Narrative  . Not on file    Family History  Problem Relation Age of Onset  . Diabetes Father   . Diabetes Maternal Grandmother   . Hyperlipidemia Maternal Grandmother   . Cancer Maternal Grandmother        peritoneal, ovarian  . Hypertension Maternal Grandmother   . Diabetes Maternal Grandfather   . Hyperlipidemia Maternal Grandfather   . Stroke Maternal Grandfather   . Heart disease Maternal Grandfather   . Heart attack Maternal Grandfather   . Arthritis Paternal Grandfather   . Diabetes Paternal Grandfather   . Anemia Mother   . Hypertension Mother   . Heart disease Paternal Grandmother   . Hypertension Paternal Grandmother   . Heart attack Paternal Grandmother   . Depression Maternal Aunt   . Hearing loss Neg Hx      ROS Review of Systems See HPI Constitution: No fevers or chills No malaise No diaphoresis Skin: No rash or itching Eyes: no blurry vision, no double vision GU: no dysuria or hematuria Neuro: no dizziness or headaches * all others reviewed and negative   Objective: There were no vitals filed for this visit.  Physical Exam  Assessment and Plan There are no diagnoses linked to  this encounter.   Megan Skinner

## 2017-08-03 ENCOUNTER — Ambulatory Visit: Payer: Self-pay | Admitting: Family Medicine

## 2017-08-03 ENCOUNTER — Other Ambulatory Visit: Payer: Self-pay

## 2017-08-03 ENCOUNTER — Encounter: Payer: Self-pay | Admitting: Family Medicine

## 2017-08-03 ENCOUNTER — Ambulatory Visit (INDEPENDENT_AMBULATORY_CARE_PROVIDER_SITE_OTHER): Payer: 59 | Admitting: Family Medicine

## 2017-08-03 VITALS — BP 102/70 | HR 79 | Temp 98.5°F | Resp 16 | Ht 63.0 in | Wt 175.6 lb

## 2017-08-03 DIAGNOSIS — Z13220 Encounter for screening for lipoid disorders: Secondary | ICD-10-CM

## 2017-08-03 DIAGNOSIS — Z0000 Encounter for general adult medical examination without abnormal findings: Secondary | ICD-10-CM

## 2017-08-03 DIAGNOSIS — M6208 Separation of muscle (nontraumatic), other site: Secondary | ICD-10-CM

## 2017-08-03 DIAGNOSIS — Z1321 Encounter for screening for nutritional disorder: Secondary | ICD-10-CM

## 2017-08-03 NOTE — Patient Instructions (Addendum)
IF you received an x-ray today, you will receive an invoice from [facility]. Please contact [facility] at [phone] with questions or concerns regarding your invoice.   IF you received labwork today, you will receive an invoice from [facility]. Please contact [facility] at [phone] with questions or concerns regarding your invoice.   Our billing staff will not be able to assist you with questions regarding bills from these companies.  You will be contacted with the lab results as soon as they are available. The fastest way to get your results is to activate your My Chart account. Instructions are located on the last page of this paperwork. If you have not heard from us regarding the results in 2 weeks, please contact this office.      Diastasis Recti Diastasis recti is when the muscles of the abdomen (rectus abdominis muscles) become thin and separate. The result is a wider space between the right and left abdomen (abdominal) muscles. This wider space between the muscles may cause a bulge in the middle of your abdomen. You may notice this bulge when you are straining or when you sit up from a lying down position. Diastasis recti can affect men and women. It is most common among pregnant women, infants, people who are obese, and people who have had abdominal surgery. Exercise or surgical treatment may help correct it. What are the causes? Common causes of this condition include:  Pregnancy. The growing uterus puts pressure on the abdominal muscles, which causes the muscles to separate.  Obesity. Excess fat puts pressure on abdominal muscles.  Weightlifting.  Some abdomen exercises.  Advanced age.  Genetics.  Prior abdominal surgery.  What increases the risk? This condition is more likely to develop in:  Women.  Newborns, especially newborns who are born early (prematurely).  What are the signs or symptoms? Common symptoms of this condition  include:  A bulge in the middle of the abdomen. You will notice it most when you sit up or strain.  Pain in the low back, pelvis, or hips.  Constipation.  Inability to control when you urinate (urinary incontinence).  Bloating.  Poor posture.  How is this diagnosed? This condition is diagnosed with a physical exam. Your health care provider will ask you to lie flat on your back and do a crunch or half sit-up. If you have diastasis recti, a vertical bulge will appear between your abdominal muscles in the center of your abdomen. Your health care provider will measure the gap between your muscles with one of the following:  A medical device used to measure the space between two objects (caliper).  A tape measure.  CT scan.  Ultrasound.  Finger spaces. Your health care provider will measure the space using their fingers.  How is this treated? If your muscle separation is not too large, you may not need treatment. However, if you are a woman who plans to become pregnant again, you should treat this condition before your next pregnancy. Treatment may include:  Physical therapy to strengthen and tighten your abdominal muscles.  Lifestyle changes such as weight loss and exercise.  Over-the-counter pain medicines as needed.  Surgery to correct the separation.  Follow these instructions at home: Activity  Return to your normal activities as told by your health care provider. Ask your health care provider what activities are safe for you.  When lifting weights or doing exercises using your abdominal muscles or the muscles in the center of your body  that give stability (core muscles), make sure you are doing your exercises and movements correctly. Proper form can help to prevent the condition from happening again. General instructions  If you are overweight, ask your health care provider for help with weight loss. Losing even a small amount of weight can help to improve your  diastasis recti.  Take over-the-counter or prescription medicines only as told by your health care provider.  Do not strain. Straining can make the separation worse. Examples of straining include: ? Pushing hard to have a bowel movement, such as due to constipation. ? Lifting heavy objects, including children. ? Standing up and sitting down.  Take steps to prevent constipation: ? Drink enough fluid to keep your urine clear or pale yellow. ? Take over-the-counter or prescription medicines only as directed. ? Eat foods that are high in fiber, such as fresh fruits and vegetables, whole grains, and beans. ? Limit foods that are high in fat and processed sugars, such as fried and sweet foods. Contact a health care provider if:  You notice a new bulge in your abdomen. Get help right away if:  You experience severe discomfort in your abdomen.  You develop severe abdominal pain along with nausea, vomiting, or fever. Summary  Diastasis recti is when the abdomen (abdominal) muscles become thin and separate. Your abdomen will stick out because the space between your right and left abdomen muscles has widened.  The most common symptom is a bulge in your abdomen. You will notice it most when you sit up or are straining.  This condition is diagnosed during a physical exam.  If the abdomen separation is not too big, you may choose not to have treatment. Otherwise, you may need to undergo physical therapy or surgery. This information is not intended to replace advice given to you by your health care provider. Make sure you discuss any questions you have with your health care provider. Document Released: 08/15/2016 Document Revised: 08/15/2016 Document Reviewed: 08/15/2016 Elsevier Interactive Patient Education  [employer].

## 2017-08-03 NOTE — Progress Notes (Signed)
Chief Complaint  Patient presents with  . Annual Exam    no pap    Subjective:  Megan Skinner is a 27 y.o. female here for a health maintenance visit.  Patient is new  pt  Pt reports that at age 27 she had umbilical hernia with repair She reports that the hernia seems to be back She had G2P2002 in 2014 (7#11) and 2018 (8#9) both by c-sections  She reports that she is not sure that she is done having babies   Patient Active Problem List   Diagnosis Date Noted  . S/P cesarean section 12/02/2012    Past Medical History:  Diagnosis Date  . Medical history non-contributory     Past Surgical History:  Procedure Laterality Date  . ABDOMINAL HERNIA REPAIR  2001  . CESAREAN SECTION N/A 11/29/2012   Procedure: CESAREAN SECTION;  Surgeon: Bing Plumehomas F Henley, MD;  Location: [facility];  Service: Obstetrics;  Laterality: N/A;  . CESAREAN SECTION N/A 07/05/2016   Procedure: CESAREAN SECTION;  Surgeon: Gerald Leitzara Cole, MD;  [facility];  Service: Obstetrics;  Laterality: N/A;  . HERNIA REPAIR       Outpatient Medications Prior to Visit  Medication Sig Dispense Refill  . docusate sodium (COLACE) 100 MG capsule Take 1 capsule (100 mg total) by mouth 2 (two) times daily. (Patient not taking: Reported on 08/03/2017) 30 capsule 0  . ferrous sulfate 325 (65 FE) MG tablet Take 1 tablet (325 mg total) by mouth daily with breakfast. (Patient not taking: Reported on 08/03/2017) 30 tablet 3  . ibuprofen (ADVIL,MOTRIN) 600 MG tablet Take 1 tablet (600 mg total) by mouth every 6 (six) hours. (Patient not taking: Reported on 08/03/2017) 30 tablet 0  . oxyCODONE-acetaminophen (ROXICET) 5-325 MG tablet Take 1 tablet by mouth every 6 (six) hours as needed for severe pain. (Patient not taking: Reported on 08/03/2017) 15 tablet 0  . Prenatal Vit-Fe Fumarate-FA (PRENATAL MULTIVITAMIN) TABS tablet Take 1 tablet by mouth daily.      No facility-administered medications prior to visit.     No Known  Allergies   Family History  Problem Relation Age of Onset  . Diabetes Father   . Diabetes Maternal Grandmother   . Hyperlipidemia Maternal Grandmother   . Cancer Maternal Grandmother        peritoneal, ovarian  . Hypertension Maternal Grandmother   . Diabetes Maternal Grandfather   . Hyperlipidemia Maternal Grandfather   . Stroke Maternal Grandfather   . Heart disease Maternal Grandfather   . Heart attack Maternal Grandfather   . Arthritis Paternal Grandfather   . Diabetes Paternal Grandfather   . Anemia Mother   . Hypertension Mother   . Heart disease Paternal Grandmother   . Hypertension Paternal Grandmother   . Heart attack Paternal Grandmother   . Depression Maternal Aunt   . Hearing loss Neg Hx      Health Habits: Dental Exam: up to date Eye Exam: up to date Exercise: 4 times/week on average Current exercise activities: walking/running Diet:   Social History   Socioeconomic History  . Marital status: Married    Spouse name: Not on file  . Number of children: Not on file  . Years of education: Not on file  . Highest education level: Not on file  Social Needs  . Financial resource strain: Not on file  . Food insecurity - worry: Not on file  . Food insecurity - inability: Not on file  . Transportation needs -  medical: Not on file  . Transportation needs - non-medical: Not on file  Occupational History  . Not on file  Tobacco Use  . Smoking status: Never Smoker  . Smokeless tobacco: Never Used  Substance and Sexual Activity  . Alcohol use: No  . Drug use: No  . Sexual activity: Yes  Other Topics Concern  . Not on file  Social History Narrative  . Not on file   Social History   Substance and Sexual Activity  Alcohol Use No   Social History   Tobacco Use  Smoking Status Never Smoker  Smokeless Tobacco Never Used   Social History   Substance and Sexual Activity  Drug Use No    GYN: Sexual Health Menstrual status: regular menses LMP:  Patient's last menstrual period was 07/19/2017. Last pap smear: see HM section History of abnormal pap smears:  Sexually active: ** with ** partner Current contraception:   Health Maintenance: See under health Maintenance activity for review of completion dates as well. There is no immunization history for the selected administration types on file for this patient.    Depression Screen-PHQ2/9 Depression screen [facility] 2/9 08/03/2017  Decreased Interest 0  Down, Depressed, Hopeless 0  PHQ - 2 Score 0      Depression Severity and Treatment Recommendations:  0-4= None  5-9= Mild / Treatment: Support, educate to call if worse; return in one month  10-14= Moderate / Treatment: Support, watchful waiting; Antidepressant or Psycotherapy  15-19= Moderately severe / Treatment: Antidepressant OR Psychotherapy  >= 20 = Major depression, severe / Antidepressant AND Psychotherapy    Review of Systems   Review of Systems  Constitutional: Negative for chills and fever.  Eyes: Negative for blurred vision and double vision.  Respiratory: Negative for cough and shortness of breath.   Cardiovascular: Negative for chest pain and palpitations.  Gastrointestinal: Negative for abdominal pain, nausea and vomiting.  Genitourinary: Negative for dysuria and urgency.  Musculoskeletal: Negative for back pain, myalgias and neck pain.  Skin: Negative for itching and rash.  Neurological: Negative for dizziness, tingling and headaches.  Psychiatric/Behavioral: Negative for depression. The patient is not nervous/anxious.     See HPI for ROS as well.    Objective:   Vitals:   08/03/17 1004  BP: 102/70  Pulse: 79  Resp: 16  Temp: 98.5 F (36.9 C)  TempSrc: Oral  SpO2: 99%  Weight: 175 lb 9.6 oz (79.7 kg)  Height: 5\' 3"  (1.6 m)    Body mass index is 31.11 kg/m.  Physical Exam  Constitutional: She is oriented to person, place, and time. She appears well-developed and well-nourished.  HENT:    Head: Normocephalic and atraumatic.  Eyes: Conjunctivae and EOM are normal.  Neck: Normal range of motion. No thyromegaly present.  Cardiovascular: Normal rate, regular rhythm and normal heart sounds.  Pulmonary/Chest: Effort normal and breath sounds normal. No respiratory distress. She has no wheezes.  Abdominal: Soft. Bowel sounds are normal. She exhibits no distension. There is no tenderness. There is no CVA tenderness. A hernia is present. Hernia confirmed positive in the ventral area.    Musculoskeletal: Normal range of motion. She exhibits no edema.  Neurological: She is alert and oriented to person, place, and time. She has normal reflexes.  Skin: Skin is warm. No erythema.  Psychiatric: She has a normal mood and affect. Her behavior is normal. Judgment and thought content normal.       Assessment/Plan:   Patient was seen for a  health maintenance exam.  Counseled the patient on health maintenance issues. Reviewed her health mainteance schedule and ordered appropriate tests (see orders.) Counseled on regular exercise and weight management. Recommend regular eye exams and dental cleaning.   The following issues were addressed today for health maintenance:   Lasandra was seen today for annual exam.  Diagnoses and all orders for this visit:  Health maintenance examination- age appropriate screenings    Diastasis recti-  Advised pt to do PT to improve  Since she is not done with childbearing will not refer to Gen Surg No obstruction -     Ambulatory referral to Physical Therapy  Screening, lipid -     Lipid panel  Encounter for vitamin deficiency screening -     VITAMIN D 25 Hydroxy (Vit-D Deficiency, Fractures)  Other orders -     Cancel: Tdap vaccine greater than or equal to 7yo IM -     Cancel: Flu Vaccine QUAD 36+ mos IM    Return in about 1 year (around 08/03/2018) for physical exam.    Body mass index is 31.11 kg/m.:  Discussed the patient's BMI with  patient. The BMI body mass index is 31.11 kg/m.     No future appointments.  Patient Instructions       IF you received an x-ray today, you will receive an invoice from [facility]. Please contact [facility] at [phone] with questions or concerns regarding your invoice.   IF you received labwork today, you will receive an invoice from [facility]. Please contact [facility] at [phone] with questions or concerns regarding your invoice.   Our billing staff will not be able to assist you with questions regarding bills from these companies.  You will be contacted with the lab results as soon as they are available. The fastest way to get your results is to activate your My Chart account. Instructions are located on the last page of this paperwork. If you have not heard from [place] regarding the results in 2 weeks, please contact this office.      Diastasis Recti Diastasis recti is when the muscles of the abdomen (rectus abdominis muscles) become thin and separate. The result is a wider space between the right and left abdomen (abdominal) muscles. This wider space between the muscles may cause a bulge in the middle of your abdomen. You may notice this bulge when you are straining or when you sit up from a lying down position. Diastasis recti can affect men and women. It is most common among pregnant women, infants, people who are obese, and people who have had abdominal surgery. Exercise or surgical treatment may help correct it. What are the causes? Common causes of this condition include:  Pregnancy. The growing uterus puts pressure on the abdominal muscles, which causes the muscles to separate.  Obesity. Excess fat puts pressure on abdominal muscles.  Weightlifting.  Some abdomen exercises.  Advanced age.  Genetics.  Prior abdominal surgery.  What increases the risk? This condition is more likely to develop in:  Women.  Newborns, especially newborns who  are born early (prematurely).  What are the signs or symptoms? Common symptoms of this condition include:  A bulge in the middle of the abdomen. You will notice it most when you sit up or strain.  Pain in the low back, pelvis, or hips.  Constipation.  Inability to control when you urinate (urinary incontinence).  Bloating.  Poor posture.  How is this diagnosed? This condition is diagnosed with  a physical exam. Your health care provider will ask you to lie flat on your back and do a crunch or half sit-up. If you have diastasis recti, a vertical bulge will appear between your abdominal muscles in the center of your abdomen. Your health care provider will measure the gap between your muscles with one of the following:  A medical device used to measure the space between two objects (caliper).  A tape measure.  CT scan.  Ultrasound.  Finger spaces. Your health care provider will measure the space using their fingers.  How is this treated? If your muscle separation is not too large, you may not need treatment. However, if you are a woman who plans to become pregnant again, you should treat this condition before your next pregnancy. Treatment may include:  Physical therapy to strengthen and tighten your abdominal muscles.  Lifestyle changes such as weight loss and exercise.  Over-the-counter pain medicines as needed.  Surgery to correct the separation.  Follow these instructions at home: Activity  Return to your normal activities as told by your health care provider. Ask your health care provider what activities are safe for you.  When lifting weights or doing exercises using your abdominal muscles or the muscles in the center of your body that give stability (core muscles), make sure you are doing your exercises and movements correctly. Proper form can help to prevent the condition from happening again. General instructions  If you are overweight, ask your health care  provider for help with weight loss. Losing even a small amount of weight can help to improve your diastasis recti.  Take over-the-counter or prescription medicines only as told by your health care provider.  Do not strain. Straining can make the separation worse. Examples of straining include: ? Pushing hard to have a bowel movement, such as due to constipation. ? Lifting heavy objects, including children. ? Standing up and sitting down.  Take steps to prevent constipation: ? Drink enough fluid to keep your urine clear or pale yellow. ? Take over-the-counter or prescription medicines only as directed. ? Eat foods that are high in fiber, such as fresh fruits and vegetables, whole grains, and beans. ? Limit foods that are high in fat and processed sugars, such as fried and sweet foods. Contact a health care provider if:  You notice a new bulge in your abdomen. Get help right away if:  You experience severe discomfort in your abdomen.  You develop severe abdominal pain along with nausea, vomiting, or fever. Summary  Diastasis recti is when the abdomen (abdominal) muscles become thin and separate. Your abdomen will stick out because the space between your right and left abdomen muscles has widened.  The most common symptom is a bulge in your abdomen. You will notice it most when you sit up or are straining.  This condition is diagnosed during a physical exam.  If the abdomen separation is not too big, you may choose not to have treatment. Otherwise, you may need to undergo physical therapy or surgery. This information is not intended to replace advice given to you by your health care provider. Make sure you discuss any questions you have with your health care provider. Document Released: 08/15/2016 Document Revised: 08/15/2016 Document Reviewed: 08/15/2016 Elsevier Interactive Patient Education  [employer].

## 2017-08-04 LAB — LIPID PANEL
CHOLESTEROL TOTAL: 165 mg/dL (ref 100–199)
Chol/HDL Ratio: 2.9 ratio (ref 0.0–4.4)
HDL: 56 mg/dL (ref >39)
LDL CALC: 93 mg/dL (ref 0–99)
TRIGLYCERIDES: 80 mg/dL (ref 0–149)
VLDL Cholesterol Cal: 16 mg/dL (ref 5–40)

## 2017-08-04 LAB — VITAMIN D 25 HYDROXY (VIT D DEFICIENCY, FRACTURES): Vit D, 25-Hydroxy: 16.1 ng/mL — ABNORMAL LOW (ref 30.0–100.0)

## 2018-03-26 ENCOUNTER — Encounter: Payer: Self-pay | Admitting: Family Medicine

## 2018-04-14 ENCOUNTER — Emergency Department (HOSPITAL_BASED_OUTPATIENT_CLINIC_OR_DEPARTMENT_OTHER): Payer: 59

## 2018-04-14 ENCOUNTER — Emergency Department (HOSPITAL_BASED_OUTPATIENT_CLINIC_OR_DEPARTMENT_OTHER)
Admission: EM | Admit: 2018-04-14 | Discharge: 2018-04-14 | Disposition: A | Discharge status: Home / Self Care | Payer: 59 | Attending: Emergency Medicine | Admitting: Emergency Medicine

## 2018-04-14 ENCOUNTER — Encounter (HOSPITAL_BASED_OUTPATIENT_CLINIC_OR_DEPARTMENT_OTHER): Payer: Self-pay

## 2018-04-14 ENCOUNTER — Other Ambulatory Visit: Payer: Self-pay

## 2018-04-14 DIAGNOSIS — R1031 Right lower quadrant pain: Secondary | ICD-10-CM

## 2018-04-14 DIAGNOSIS — K439 Ventral hernia without obstruction or gangrene: Secondary | ICD-10-CM | POA: Diagnosis not present

## 2018-04-14 LAB — URINALYSIS, ROUTINE W REFLEX MICROSCOPIC
Bilirubin Urine: NEGATIVE
Glucose, UA: NEGATIVE mg/dL
Hgb urine dipstick: NEGATIVE
Ketones, ur: NEGATIVE mg/dL
Leukocytes, UA: NEGATIVE
NITRITE: NEGATIVE
Protein, ur: NEGATIVE mg/dL
pH: 7 (ref 5.0–8.0)

## 2018-04-14 LAB — CBC
HEMATOCRIT: 40.6 % (ref 36.0–46.0)
Hemoglobin: 12.7 g/dL (ref 12.0–15.0)
MCH: 23.3 pg — ABNORMAL LOW (ref 26.0–34.0)
MCHC: 31.3 g/dL (ref 30.0–36.0)
MCV: 74.6 fL — AB (ref 80.0–100.0)
NRBC: 0 % (ref 0.0–0.2)
Platelets: 214 10*3/uL (ref 150–400)
RBC: 5.44 MIL/uL — ABNORMAL HIGH (ref 3.87–5.11)
RDW: 15.2 % (ref 11.5–15.5)
WBC: 7.5 10*3/uL (ref 4.0–10.5)

## 2018-04-14 LAB — COMPREHENSIVE METABOLIC PANEL
ALT: 14 U/L (ref 0–44)
AST: 22 U/L (ref 15–41)
Albumin: 4.2 g/dL (ref 3.5–5.0)
Alkaline Phosphatase: 37 U/L — ABNORMAL LOW (ref 38–126)
Anion gap: 10 (ref 5–15)
BUN: 13 mg/dL (ref 6–20)
CHLORIDE: 99 mmol/L (ref 98–111)
CO2: 28 mmol/L (ref 22–32)
CREATININE: 0.71 mg/dL (ref 0.44–1.00)
Calcium: 9.1 mg/dL (ref 8.9–10.3)
GFR calc Af Amer: >60 mL/min (ref >60)
GFR calc non Af Amer: >60 mL/min (ref >60)
Glucose, Bld: 99 mg/dL (ref 70–99)
Potassium: 3.7 mmol/L (ref 3.5–5.1)
SODIUM: 137 mmol/L (ref 135–145)
Total Bilirubin: 0.3 mg/dL (ref 0.3–1.2)
Total Protein: 7.7 g/dL (ref 6.5–8.1)

## 2018-04-14 LAB — PREGNANCY, URINE: Preg Test, Ur: NEGATIVE

## 2018-04-14 LAB — LIPASE, BLOOD: LIPASE: 33 U/L (ref 11–51)

## 2018-04-14 MED ORDER — DICYCLOMINE HCL 20 MG PO TABS: 20.0000 mg | ORAL_TABLET | Freq: Three times a day (TID) | ORAL | 0 refills | Status: DC

## 2018-04-14 MED ORDER — IOPAMIDOL (ISOVUE-300) INJECTION 61%
100.0000 mL | Freq: Once | INTRAVENOUS | Status: AC | PRN
  Administered 2018-04-14: 100 mL via INTRAVENOUS

## 2018-04-14 MED ORDER — IBUPROFEN 400 MG PO TABS
600.0000 mg | ORAL_TABLET | Freq: Once | ORAL | Status: AC
  Administered 2018-04-14: 21:00:00 600 mg via ORAL
  Filled 2018-04-14: qty 1

## 2018-04-14 NOTE — ED Provider Notes (Signed)
[facility] EMERGENCY DEPARTMENT Provider Note   CSN: [id number] Arrival date & time: 04/14/18  1645     History   Chief Complaint Chief Complaint  Patient presents with  . Abdominal Pain    HPI Megan Skinner is a 27 y.o. female who presents today for evaluation of abdominal pain.  She has a past medical history of cesarean section x2, hernia repair, ventral hernia, who presents today for evaluation of right lower quadrant pain.  She also reports nausea with intermittent diarrhea that started today.  Reports generally decreased appetite.  She has been trying to treat this at home with heating pack without significant relief.  She reports that she went to her OB/GYN approximately 1 week ago where she had a full pelvic exam which came back normal.  She denies any recent trauma.  Is never had a similar pain before.  HPI  Past Medical History:  Diagnosis Date  . Medical history non-contributory     Patient Active Problem List   Diagnosis Date Noted  . S/P cesarean section 12/02/2012    Past Surgical History:  Procedure Laterality Date  . ABDOMINAL HERNIA REPAIR  2001  . CESAREAN SECTION N/A 11/29/2012   Procedure: CESAREAN SECTION;  Surgeon: Bing Plume, MD;  Location: [facility];  Service: Obstetrics;  Laterality: N/A;  . CESAREAN SECTION N/A 07/05/2016   Procedure: CESAREAN SECTION;  Surgeon: Gerald Leitz, MD;  Location: [facility]THING SUIT[facility];  Service: Obstetrics;  Laterality: N/A;  . HERNIA REPAIR       OB History    Gravida  2   Para  2   Term  2   Preterm      AB      Living  2     SAB      TAB      Ectopic      Multiple  0   Live Births  2            Home Medications    Prior to Admission medications   Medication Sig Start Date End Date Taking? Authorizing Provider  dicyclomine (BENTYL) 20 MG tablet Take 1 tablet (20 mg total) by mouth 4 (four) times daily -  before meals and at bedtime for 10 days. 04/14/18 04/24/18  Cristina Gong, PA-C    Family History Family History  Problem Relation Age of Onset  . Diabetes Father   . Diabetes Maternal Grandmother   . Hyperlipidemia Maternal Grandmother   . Cancer Maternal Grandmother        peritoneal, ovarian  . Hypertension Maternal Grandmother   . Diabetes Maternal Grandfather   . Hyperlipidemia Maternal Grandfather   . Stroke Maternal Grandfather   . Heart disease Maternal Grandfather   . Heart attack Maternal Grandfather   . Arthritis Paternal Grandfather   . Diabetes Paternal Grandfather   . Anemia Mother   . Hypertension Mother   . Heart disease Paternal Grandmother   . Hypertension Paternal Grandmother   . Heart attack Paternal Grandmother   . Depression Maternal Aunt   . Hearing loss Neg Hx     Social History Social History   Tobacco Use  . Smoking status: Never Smoker  . Smokeless tobacco: Never Used  Substance Use Topics  . Alcohol use: Not Currently    Comment: occ  . Drug use: No     Allergies   Patient has no known allergies.   Review of Systems Review of Systems  Constitutional: Negative for chills and fever.  Respiratory: Negative for chest tightness and shortness of breath.   Cardiovascular: Negative for chest pain and palpitations.  Gastrointestinal: Positive for abdominal pain, diarrhea, nausea and vomiting.  Genitourinary: Negative for dysuria, flank pain, frequency, hematuria and urgency.  Neurological: Negative for weakness and headaches.  All other systems reviewed and are negative.    Physical Exam Updated Vital Signs BP 115/63 (BP Location: Right Arm)   Pulse 75   Temp 98.2 F (36.8 C) (Oral)   Resp 16   Ht 5\' 4"  (1.626 m)   Wt 83.9 kg   LMP 03/28/2018 Comment: neg upreg  SpO2 100%   BMI 31.76 kg/m   Physical Exam  Constitutional: She appears well-developed and well-nourished.  Non-toxic appearance. No distress.  HENT:  Head: Normocephalic and atraumatic.  Eyes: Conjunctivae are normal.  Neck:  Neck supple.  Cardiovascular: Normal rate, regular rhythm and normal heart sounds.  No murmur heard. Pulmonary/Chest: Effort normal and breath sounds normal. No respiratory distress.  Abdominal: Soft. Bowel sounds are normal. There is tenderness in the right lower quadrant. A hernia (Supraumbilical, is easily reduced, no redness or pain. ) is present.  Musculoskeletal: She exhibits no edema.  Neurological: She is alert.  Skin: Skin is warm and dry.  Psychiatric: She has a normal mood and affect.  Nursing note and vitals reviewed.    ED Treatments / Results  Labs (all labs ordered are listed, but only abnormal results are displayed) Labs Reviewed  URINALYSIS, ROUTINE W REFLEX MICROSCOPIC - Abnormal; Notable for the following components:      Result Value   Color, Urine STRAW (*)    Specific Gravity, Urine <1.005 (*)    All other components within normal limits  COMPREHENSIVE METABOLIC PANEL - Abnormal; Notable for the following components:   Alkaline Phosphatase 37 (*)    All other components within normal limits  CBC - Abnormal; Notable for the following components:   RBC 5.44 (*)    MCV 74.6 (*)    MCH 23.3 (*)    All other components within normal limits  PREGNANCY, URINE  LIPASE, BLOOD    EKG None  Radiology Ct Abdomen Pelvis W Contrast  Result Date: 04/14/2018 CLINICAL DATA:  Right lower quadrant pain for 4 days. Nausea and diarrhea. Clinical suspicion for appendicitis. EXAM: CT ABDOMEN AND PELVIS WITH CONTRAST TECHNIQUE: Multidetector CT imaging of the abdomen and pelvis was performed using the standard protocol following bolus administration of intravenous contrast. CONTRAST:  ISOVUE-300 IOPAMIDOL (ISOVUE-300) INJECTION 61% COMPARISON:  01/25/2010 FINDINGS: Lower Chest: No acute findings. Hepatobiliary: No hepatic masses identified. Gallbladder is unremarkable. Pancreas:  No mass or inflammatory changes. Spleen: Within normal limits in size and appearance.  Adrenals/Urinary Tract: No masses identified. No evidence of hydronephrosis. Stomach/Bowel: No evidence of obstruction, inflammatory process or abnormal fluid collections. Normal appendix visualized. Vascular/Lymphatic: No pathologically enlarged lymph nodes. No abdominal aortic aneurysm. Reproductive: Normal appearance of uterus and adnexal regions. Tiny amount of free fluid in pelvic cul-de-sac is most likely physiologic. Other: A small paraumbilical ventral hernia is seen containing only fat. Musculoskeletal:  No suspicious bone lesions identified. IMPRESSION: No evidence of appendicitis. Tiny amount of free fluid in pelvic cul-de-sac, most likely physiologic. Small paraumbilical ventral hernia containing only fat. Electronically Signed   By: Myles Rosenthal M.D.   On: 04/14/2018 20:30    Procedures Procedures (including critical care time)  Medications Ordered in ED Medications  iopamidol (ISOVUE-300) 61 % injection 100  mL (100 mLs Intravenous Contrast Given 04/14/18 1947)  ibuprofen (ADVIL,MOTRIN) tablet 600 mg (600 mg Oral Given 04/14/18 2113)     Initial Impression / Assessment and Plan / ED Course  I have reviewed the triage vital signs and the nursing notes.  Pertinent labs & imaging results that were available during my care of the patient were reviewed by me and considered in my medical decision making (see chart for details).    Patient is nontoxic, nonseptic appearing, in no apparent distress.  Patient's pain and other symptoms adequately managed in emergency department.  Fluid bolus given.  Labs, imaging and vitals reviewed.  Patient does not meet the SIRS or Sepsis criteria.  On repeat exam patient does not have a surgical abdomen and there are no peritoneal signs.  No indication of appendicitis, bowel obstruction, bowel perforation, cholecystitis, diverticulitis.  Given recent pelvic by OB/GYN with reportedly normal testing patient deferred pelvic exam.  Patient discharged home with  symptomatic treatment and given strict instructions for follow-up with their primary care physician.  I have also discussed reasons to return immediately to the ER.  Patient expresses understanding and agrees with plan.    Final Clinical Impressions(s) / ED Diagnoses   Final diagnoses:  Right lower quadrant abdominal pain  Ventral hernia without obstruction or gangrene    ED Discharge Orders         Ordered    dicyclomine (BENTYL) 20 MG tablet  3 times daily before meals & bedtime     04/14/18 2111           Cristina Gong, PA-C 04/15/18 0037    Vanetta Mulders, MD 04/15/18 1600

## 2018-04-14 NOTE — Discharge Instructions (Signed)
I have given you a prescription today for Bentyl.  This may make you slightly drowsy and sleepy so please use caution and do not operate motor vehicles or perform other potentially dangerous activities until you know how this medication will affect you.  Please try to eat a high-fiber diet, you may add MiraLAX once a day to help with any constipation.  If your symptoms change, worsen, or you have additional concerns please seek additional medical care and evaluation.  If your symptoms do not start improving by Monday please call your primary care doctor for a repeat appointment.

## 2018-04-14 NOTE — ED Triage Notes (Signed)
Pt c/o RLQ abd pain x3 days. Pt states nausea has been intermittent and diarrhea that started today.

## 2018-04-15 DIAGNOSIS — N898 Other specified noninflammatory disorders of vagina: Secondary | ICD-10-CM | POA: Insufficient documentation

## 2018-04-15 DIAGNOSIS — B9689 Other specified bacterial agents as the cause of diseases classified elsewhere: Secondary | ICD-10-CM | POA: Insufficient documentation

## 2018-04-15 DIAGNOSIS — Z01419 Encounter for gynecological examination (general) (routine) without abnormal findings: Secondary | ICD-10-CM | POA: Insufficient documentation

## 2018-04-15 HISTORY — DX: Other specified noninflammatory disorders of vagina: N89.8

## 2018-04-15 HISTORY — DX: Other specified bacterial agents as the cause of diseases classified elsewhere: B96.89

## 2018-04-15 HISTORY — DX: Encounter for gynecological examination (general) (routine) without abnormal findings: Z01.419

## 2018-05-01 ENCOUNTER — Ambulatory Visit: Payer: 59 | Admitting: Family Medicine

## 2018-05-01 ENCOUNTER — Encounter

## 2018-08-13 ENCOUNTER — Ambulatory Visit: Payer: 59 | Admitting: Family Medicine

## 2018-10-09 ENCOUNTER — Ambulatory Visit: Payer: 59 | Admitting: Family Medicine

## 2019-03-25 ENCOUNTER — Ambulatory Visit (INDEPENDENT_AMBULATORY_CARE_PROVIDER_SITE_OTHER): Payer: 59 | Admitting: Family Medicine

## 2019-03-25 ENCOUNTER — Encounter: Payer: Self-pay | Admitting: Family Medicine

## 2019-03-25 ENCOUNTER — Other Ambulatory Visit: Payer: Self-pay

## 2019-03-25 VITALS — BP 120/83 | HR 67 | Temp 99.9°F | Resp 17 | Ht 64.0 in | Wt 189.6 lb

## 2019-03-25 DIAGNOSIS — Z23 Encounter for immunization: Secondary | ICD-10-CM

## 2019-03-25 DIAGNOSIS — Z01818 Encounter for other preprocedural examination: Secondary | ICD-10-CM | POA: Diagnosis not present

## 2019-03-25 DIAGNOSIS — Z299 Encounter for prophylactic measures, unspecified: Secondary | ICD-10-CM

## 2019-03-25 LAB — POCT URINALYSIS DIP (MANUAL ENTRY)
Bilirubin, UA: NEGATIVE
Blood, UA: NEGATIVE
Glucose, UA: NEGATIVE mg/dL
Ketones, POC UA: NEGATIVE mg/dL
Leukocytes, UA: NEGATIVE
Nitrite, UA: NEGATIVE
Protein Ur, POC: NEGATIVE mg/dL
Spec Grav, UA: >=1.03 — AB (ref 1.010–1.025)
Urobilinogen, UA: 0.2 E.U./dL
pH, UA: 7.5 (ref 5.0–8.0)

## 2019-03-25 NOTE — Patient Instructions (Signed)
° ° ° °  If you have lab work done today you will be contacted with your lab results within the next 2 weeks.  If you have not heard from us then please contact us. The fastest way to get your results is to register for My Chart. ° ° °IF you received an x-ray today, you will receive an invoice from [facility]. Please contact [facility] at [phone] with questions or concerns regarding your invoice.  ° °IF you received labwork today, you will receive an invoice from [facility]. Please contact [facility] at [phone] with questions or concerns regarding your invoice.  ° °Our billing staff will not be able to assist you with questions regarding bills from these companies. ° °You will be contacted with the lab results as soon as they are available. The fastest way to get your results is to activate your My Chart account. Instructions are located on the last page of this paperwork. If you have not heard from us regarding the results in 2 weeks, please contact this office. °  ° ° ° °

## 2019-03-25 NOTE — Progress Notes (Signed)
Established Patient Office Visit  Subjective:  Patient ID: Megan Skinner, female    DOB: 01-07-1991  Age: 28 y.o. MRN: [id number]  CC:  Chief Complaint  Patient presents with  . sugical clearance    HPI Megan Skinner presents for  Pt is a [id number] who is here for pre-op clearance for umbilical hernia repair.  DATE: TBD She is not on any blood thinners and does not take any daily meds. She had general anesthesia without complications She has not issues with her airway and denies complication or complex intubation She has not heart or thyroid disease She denies history of anemia or UTI   Past Medical History:  Diagnosis Date  . Medical history non-contributory     Past Surgical History:  Procedure Laterality Date  . ABDOMINAL HERNIA REPAIR  2001  . CESAREAN SECTION N/A 11/29/2012   Procedure: CESAREAN SECTION;  Surgeon: Melina Schools, MD;  Location: [facility];  Service: Obstetrics;  Laterality: N/A;  . CESAREAN SECTION N/A 07/05/2016   Procedure: CESAREAN SECTION;  Surgeon: Christophe Louis, MD;  Location: [facility];  Service: Obstetrics;  Laterality: N/A;  . HERNIA REPAIR      Family History  Problem Relation Age of Onset  . Diabetes Father   . Diabetes Maternal Grandmother   . Hyperlipidemia Maternal Grandmother   . Cancer Maternal Grandmother        peritoneal, ovarian  . Hypertension Maternal Grandmother   . Diabetes Maternal Grandfather   . Hyperlipidemia Maternal Grandfather   . Stroke Maternal Grandfather   . Heart disease Maternal Grandfather   . Heart attack Maternal Grandfather   . Arthritis Paternal Grandfather   . Diabetes Paternal Grandfather   . Anemia Mother   . Hypertension Mother   . Heart disease Paternal Grandmother   . Hypertension Paternal Grandmother   . Heart attack Paternal Grandmother   . Depression Maternal Aunt   . Hearing loss Neg Hx     Social History   Socioeconomic History  . Marital status: Married    Spouse name: Not on  file  . Number of children: Not on file  . Years of education: Not on file  . Highest education level: Not on file  Occupational History  . Not on file  Social Needs  . Financial resource strain: Not on file  . Food insecurity    Worry: Not on file    Inability: Not on file  . Transportation needs    Medical: Not on file    Non-medical: Not on file  Tobacco Use  . Smoking status: Never Smoker  . Smokeless tobacco: Never Used  Substance and Sexual Activity  . Alcohol use: Not Currently    Comment: occ  . Drug use: No  . Sexual activity: Yes  Lifestyle  . Physical activity    Days per week: Not on file    Minutes per session: Not on file  . Stress: Not on file  Relationships  . Social Herbalist on phone: Not on file    Gets together: Not on file    Attends religious service: Not on file    Active member of club or organization: Not on file    Attends meetings of clubs or organizations: Not on file    Relationship status: Not on file  . Intimate partner violence    Fear of current or ex partner: Not on file    Emotionally abused: Not on file  Physically abused: Not on file    Forced sexual activity: Not on file  Other Topics Concern  . Not on file  Social History Narrative  . Not on file    Outpatient Medications Prior to Visit  Medication Sig Dispense Refill  . dicyclomine (BENTYL) 20 MG tablet Take 1 tablet (20 mg total) by mouth 4 (four) times daily -  before meals and at bedtime for 10 days. 30 tablet 0   No facility-administered medications prior to visit.     No Known Allergies  ROS Review of Systems Review of Systems  Constitutional: Negative for activity change, appetite change, chills and fever.  HENT: Negative for congestion, nosebleeds, trouble swallowing and voice change.   Respiratory: Negative for cough, shortness of breath and wheezing.   Gastrointestinal: Negative for diarrhea, nausea and vomiting.  Genitourinary: Negative for  difficulty urinating, dysuria, flank pain and hematuria.  Musculoskeletal: Negative for back pain, joint swelling and neck pain.  Neurological: Negative for dizziness, speech difficulty, light-headedness and numbness.  See HPI. All other review of systems negative.     Objective:    Physical Exam  BP 120/83 (BP Location: Right Arm, Patient Position: Sitting, Cuff Size: Normal)   Pulse 67   Temp 99.9 F (37.7 C) (Oral)   Resp 17   Ht 5\' 4"  (1.626 m)   Wt 189 lb 9.6 oz (86 kg)   LMP 02/20/2019   SpO2 98%   BMI 32.54 kg/m  Wt Readings from Last 3 Encounters:  03/25/19 189 lb 9.6 oz (86 kg)  04/14/18 185 lb (83.9 kg)  08/03/17 175 lb 9.6 oz (79.7 kg)    BP 120/83 (BP Location: Right Arm, Patient Position: Sitting, Cuff Size: Normal)   Pulse 67   Temp 99.9 F (37.7 C) (Oral)   Resp 17   Ht 5\' 4"  (1.626 m)   Wt 189 lb 9.6 oz (86 kg)   LMP 02/20/2019   SpO2 98%   BMI 32.54 kg/m   General Appearance:    Alert, cooperative, no distress, appears stated age  Head:    Normocephalic, without obvious abnormality, atraumatic  Eyes:    PERRL, conjunctiva/corneas clear, EOM's intact, fundi    benign, both eyes  Ears:    Normal TM's and external ear canals, both ears  Nose:   Nares normal, septum midline, mucosa normal, no drainage    or sinus tenderness  Throat:   Lips, mucosa, and tongue normal; teeth and gums normal  Neck:   Supple, symmetrical, trachea midline, no adenopathy;    thyroid:  no enlargement/tenderness/nodules; no carotid   bruit or JVD  Back:     Symmetric, no curvature, ROM normal, no CVA tenderness  Lungs:     Clear to auscultation bilaterally, respirations unlabored  Chest Wall:    No tenderness or deformity   Heart:    Regular rate and rhythm, S1 and S2 normal, no murmur, rub   or gallop  Abdomen:     Soft, non-tender, bowel sounds active all four quadrants,    no masses, no organomegaly, umbilical hernia noted, reducible  Extremities:   Extremities normal,  atraumatic, no cyanosis or edema  Pulses:   2+ and symmetric all extremities  Skin:   Skin color, texture, turgor normal, no rashes or lesions  Lymph nodes:   Cervical, supraclavicular, and axillary nodes normal  Neurologic:   CNII-XII intact, normal strength, sensation and reflexes    throughout   ECG- Normal sinus rhythm  Health  Maintenance Due  Topic Date Due  . TETANUS/TDAP  09/06/2007  . PAP SMEAR-Modifier  07/27/2018  . INFLUENZA VACCINE  02/02/2019    There are no preventive care reminders to display for this patient.  No results found for: TSH Lab Results  Component Value Date   WBC 6.5 03/25/2019   HGB 12.7 03/25/2019   HCT 41.4 03/25/2019   MCV 75 (L) 03/25/2019   PLT 253 03/25/2019   Lab Results  Component Value Date   NA 141 03/25/2019   K 4.2 03/25/2019   CO2 22 03/25/2019   GLUCOSE 80 03/25/2019   BUN 10 03/25/2019   CREATININE 0.81 03/25/2019   BILITOT 0.5 03/25/2019   ALKPHOS 46 03/25/2019   AST 19 03/25/2019   ALT 13 03/25/2019   PROT 7.7 03/25/2019   ALBUMIN 4.9 03/25/2019   CALCIUM 9.2 03/25/2019   ANIONGAP 10 04/14/2018   Lab Results  Component Value Date   CHOL 165 08/03/2017   Lab Results  Component Value Date   HDL 56 08/03/2017   Lab Results  Component Value Date   LDLCALC 93 08/03/2017   Lab Results  Component Value Date   TRIG 80 08/03/2017   Lab Results  Component Value Date   CHOLHDL 2.9 08/03/2017   No results found for: HGBA1C    Assessment & Plan:   Problem List Items Addressed This Visit    None    Visit Diagnoses    Encounter for preoperative examination for general surgical procedure    -  Primary   Relevant Orders   Comprehensive metabolic panel (Completed)   CBC (Completed)   POCT urinalysis dipstick (Completed)   EKG 12-Lead (Completed)   Need for prophylactic vaccination and inoculation against influenza       Need for prophylactic measure         Cleared for surgery based on exam, ecg and labs  Will notify the patient  No orders of the defined types were placed in this encounter.  A total of 25 minutes were spent face-to-face with the patient during this encounter and over half of that time was spent on counseling and coordination of care.  Follow-up: No follow-ups on file.    Doristine BosworthZoe A Stallings, MD

## 2019-03-26 LAB — COMPREHENSIVE METABOLIC PANEL
ALT: 13 IU/L (ref 0–32)
AST: 19 IU/L (ref 0–40)
Albumin/Globulin Ratio: 1.8 (ref 1.2–2.2)
Albumin: 4.9 g/dL (ref 3.9–5.0)
Alkaline Phosphatase: 46 IU/L (ref 39–117)
BUN/Creatinine Ratio: 12 (ref 9–23)
BUN: 10 mg/dL (ref 6–20)
Bilirubin Total: 0.5 mg/dL (ref 0.0–1.2)
CO2: 22 mmol/L (ref 20–29)
Calcium: 9.2 mg/dL (ref 8.7–10.2)
Chloride: 101 mmol/L (ref 96–106)
Creatinine, Ser: 0.81 mg/dL (ref 0.57–1.00)
GFR calc Af Amer: 113 mL/min/{1.73_m2} (ref >59)
GFR calc non Af Amer: 98 mL/min/{1.73_m2} (ref >59)
Globulin, Total: 2.8 g/dL (ref 1.5–4.5)
Glucose: 80 mg/dL (ref 65–99)
Potassium: 4.2 mmol/L (ref 3.5–5.2)
Sodium: 141 mmol/L (ref 134–144)
Total Protein: 7.7 g/dL (ref 6.0–8.5)

## 2019-03-26 LAB — CBC
Hematocrit: 41.4 % (ref 34.0–46.6)
Hemoglobin: 12.7 g/dL (ref 11.1–15.9)
MCH: 23 pg — ABNORMAL LOW (ref 26.6–33.0)
MCHC: 30.7 g/dL — ABNORMAL LOW (ref 31.5–35.7)
MCV: 75 fL — ABNORMAL LOW (ref 79–97)
Platelets: 253 10*3/uL (ref 150–450)
RBC: 5.51 x10E6/uL — ABNORMAL HIGH (ref 3.77–5.28)
RDW: 13.6 % (ref 11.7–15.4)
WBC: 6.5 10*3/uL (ref 3.4–10.8)

## 2019-07-30 ENCOUNTER — Ambulatory Visit: Payer: No Typology Code available for payment source | Attending: Internal Medicine

## 2019-07-30 DIAGNOSIS — Z20822 Contact with and (suspected) exposure to covid-19: Secondary | ICD-10-CM

## 2019-07-31 LAB — NOVEL CORONAVIRUS, NAA: SARS-CoV-2, NAA: NOT DETECTED

## 2019-11-13 ENCOUNTER — Other Ambulatory Visit: Payer: Self-pay | Admitting: Obstetrics and Gynecology

## 2019-12-24 ENCOUNTER — Encounter (HOSPITAL_COMMUNITY): Payer: Self-pay | Admitting: Obstetrics and Gynecology

## 2019-12-24 ENCOUNTER — Inpatient Hospital Stay (HOSPITAL_COMMUNITY)
Admission: AD | Admit: 2019-12-24 | Discharge: 2019-12-24 | Disposition: A | Discharge status: Home / Self Care | Payer: No Typology Code available for payment source | Attending: Obstetrics and Gynecology | Admitting: Obstetrics and Gynecology

## 2019-12-24 ENCOUNTER — Other Ambulatory Visit: Payer: Self-pay

## 2019-12-24 DIAGNOSIS — Z3A38 38 weeks gestation of pregnancy: Secondary | ICD-10-CM | POA: Diagnosis not present

## 2019-12-24 DIAGNOSIS — O479 False labor, unspecified: Secondary | ICD-10-CM

## 2019-12-24 DIAGNOSIS — O471 False labor at or after 37 completed weeks of gestation: Secondary | ICD-10-CM

## 2019-12-24 NOTE — Discharge Instructions (Signed)
Braxton Hicks Contractions Contractions of the uterus can occur throughout pregnancy, but they are not always a sign that you are in labor. You may have practice contractions called Braxton Hicks contractions. These false labor contractions are sometimes confused with true labor. What are Braxton Hicks contractions? Braxton Hicks contractions are tightening movements that occur in the muscles of the uterus before labor. Unlike true labor contractions, these contractions do not result in opening (dilation) and thinning of the cervix. Toward the end of pregnancy (32-34 weeks), Braxton Hicks contractions can happen more often and may become stronger. These contractions are sometimes difficult to tell apart from true labor because they can be very uncomfortable. You should not feel embarrassed if you go to the hospital with false labor. Sometimes, the only way to tell if you are in true labor is for your health care provider to look for changes in the cervix. The health care provider will do a physical exam and may monitor your contractions. If you are not in true labor, the exam should show that your cervix is not dilating and your water has not broken. If there are no other health problems associated with your pregnancy, it is completely safe for you to be sent home with false labor. You may continue to have Braxton Hicks contractions until you go into true labor. How to tell the difference between true labor and false labor True labor  Contractions last 30-70 seconds.  Contractions become very regular.  Discomfort is usually felt in the top of the uterus, and it spreads to the lower abdomen and low back.  Contractions do not go away with walking.  Contractions usually become more intense and increase in frequency.  The cervix dilates and gets thinner. False labor  Contractions are usually shorter and not as strong as true labor contractions.  Contractions are usually irregular.  Contractions  are often felt in the front of the lower abdomen and in the groin.  Contractions may go away when you walk around or change positions while lying down.  Contractions get weaker and are shorter-lasting as time goes on.  The cervix usually does not dilate or become thin. Follow these instructions at home:   Take over-the-counter and prescription medicines only as told by your health care provider.  Keep up with your usual exercises and follow other instructions from your health care provider.  Eat and drink lightly if you think you are going into labor.  If Braxton Hicks contractions are making you uncomfortable: ? Change your position from lying down or resting to walking, or change from walking to resting. ? Sit and rest in a tub of warm water. ? Drink enough fluid to keep your urine pale yellow. Dehydration may cause these contractions. ? Do slow and deep breathing several times an hour.  Keep all follow-up prenatal visits as told by your health care provider. This is important. Contact a health care provider if:  You have a fever.  You have continuous pain in your abdomen. Get help right away if:  Your contractions become stronger, more regular, and closer together.  You have fluid leaking or gushing from your vagina.  You pass blood-tinged mucus (bloody show).  You have bleeding from your vagina.  You have low back pain that you never had before.  You feel your baby's head pushing down and causing pelvic pressure.  Your baby is not moving inside you as much as it used to. Summary  Contractions that occur before labor are   called Braxton Hicks contractions, false labor, or practice contractions.  Braxton Hicks contractions are usually shorter, weaker, farther apart, and less regular than true labor contractions. True labor contractions usually become progressively stronger and regular, and they become more frequent.  Manage discomfort from Braxton Hicks contractions  by changing position, resting in a warm bath, drinking plenty of water, or practicing deep breathing. This information is not intended to replace advice given to you by your health care provider. Make sure you discuss any questions you have with your health care provider. Document Revised: 06/02/2017 Document Reviewed: 11/03/2016 Elsevier Patient Education  2020 [employer] Fetal Movement Counts Patient Name: ________________________________________________ Patient Due Date: ____________________ What is a fetal movement count?  A fetal movement count is the number of times that you feel your baby move during a certain amount of time. This may also be called a fetal kick count. A fetal movement count is recommended for every pregnant woman. You may be asked to start counting fetal movements as early as week 28 of your pregnancy. Pay attention to when your baby is most active. You may notice your baby's sleep and wake cycles. You may also notice things that make your baby move more. You should do a fetal movement count:  When your baby is normally most active.  At the same time each day. A good time to count movements is while you are resting, after having something to eat and drink. How do I count fetal movements? 1. Find a quiet, comfortable area. Sit, or lie down on your side. 2. Write down the date, the start time and stop time, and the number of movements that you felt between those two times. Take this information with you to your health care visits. 3. Write down your start time when you feel the first movement. 4. Count kicks, flutters, swishes, rolls, and jabs. You should feel at least 10 movements. 5. You may stop counting after you have felt 10 movements, or if you have been counting for 2 hours. Write down the stop time. 6. If you do not feel 10 movements in 2 hours, contact your health care provider for further instructions. Your health care provider may want to do additional tests  to assess your baby's well-being. Contact a health care provider if:  You feel fewer than 10 movements in 2 hours.  Your baby is not moving like he or she usually does. Date: ____________ Start time: ____________ Stop time: ____________ Movements: ____________ Date: ____________ Start time: ____________ Stop time: ____________ Movements: ____________ Date: ____________ Start time: ____________ Stop time: ____________ Movements: ____________ Date: ____________ Start time: ____________ Stop time: ____________ Movements: ____________ Date: ____________ Start time: ____________ Stop time: ____________ Movements: ____________ Date: ____________ Start time: ____________ Stop time: ____________ Movements: ____________ Date: ____________ Start time: ____________ Stop time: ____________ Movements: ____________ Date: ____________ Start time: ____________ Stop time: ____________ Movements: ____________ Date: ____________ Start time: ____________ Stop time: ____________ Movements: ____________ This information is not intended to replace advice given to you by your health care provider. Make sure you discuss any questions you have with your health care provider. Document Revised: 02/07/2019 Document Reviewed: 02/07/2019 Elsevier Patient Education  2020 Elsevier Inc.  

## 2019-12-24 NOTE — MAU Provider Note (Signed)
None     S: [name] is a 29 y.o. G3P2002 at [redacted]w[redacted]d  who presents to MAU today complaining contractions q 5-7 minutes since 1315 hours today. She denies vaginal bleeding. She denies LOF. She reports normal fetal movement.    O: BP 117/72 (BP Location: Right Arm)   Pulse 94   Resp 20   Ht 5\' 3"  (1.6 m)   Wt 89.5 kg   LMP 02/20/2019   SpO2 100%   BMI 34.97 kg/m  GENERAL: Well-developed, well-nourished female in no acute distress.  HEAD: Normocephalic, atraumatic.  CHEST: Normal effort of breathing, regular heart rate ABDOMEN: Soft, nontender, gravid  Cervical exam:  Dilation: Closed Effacement (%): Thick Cervical Position: Posterior Exam by:: [email or web address], RN   Fetal Monitoring: Baseline: 130 Variability: Mod Accelerations: POS 15 x 15 Decelerations: x 1 at 1756  Contractions: Occasional, predominantly UI   A: SIUP at [redacted]w[redacted]d  Cervix remains closed 90 minutes after initial exam At bedside for assessment of deceleration, quick return to baseline, good variability throughout Cat I for one hour following decel Reviewed with Dr. [redacted]w[redacted]d, who agrees with my plan of care   P: False labor Reactive tracing Discharge home in stable condition  F/U: PRN [facility]  [place], WIEN 12/24/2019 7:36 PM

## 2019-12-24 NOTE — MAU Note (Signed)
Presents with ctxs every 5-7 minutes since approximately 1319.  Reports FM but not as active as usual.  Denies VB or LOF.  Scheduled for repeat cesarean for July 1st.

## 2019-12-25 ENCOUNTER — Encounter (HOSPITAL_COMMUNITY): Payer: Self-pay

## 2019-12-25 NOTE — Patient Instructions (Signed)
01/02/2020 ROSELIND KLUS  12/25/2019   Your procedure is scheduled on:  01/02/2020  Arrive at 1015 at Entrance C on [address] at [facility]  and [employer]. You are invited to use the FREE valet parking or use the Visitor's parking deck.  Pick up the phone at the desk and dial [phone].  Call this number if you have problems the morning of surgery: [phone]  Remember:   Do not eat food:(After Midnight) Desps de medianoche.  Do not drink clear liquids: (After Midnight) Desps de medianoche.  Take these medicines the morning of surgery with A SIP OF WATER:  none   Do not wear jewelry, make-up or nail polish.  Do not wear lotions, powders, or perfumes. Do not wear deodorant.  Do not shave 48 hours prior to surgery.  Do not bring valuables to the hospital.  [facility] is not   responsible for any belongings or valuables brought to the hospital.  Contacts, dentures or bridgework may not be worn into surgery.  Leave suitcase in the car. After surgery it may be brought to your room.  For patients admitted to the hospital, checkout time is 11:00 AM the day of              discharge.      Please read over the following fact sheets that you were given:     Preparing for Surgery

## 2019-12-31 ENCOUNTER — Other Ambulatory Visit (HOSPITAL_COMMUNITY)
Admission: RE | Admit: 2019-12-31 | Discharge: 2019-12-31 | Disposition: A | Discharge status: Home / Self Care | Payer: No Typology Code available for payment source | Source: Ambulatory Visit | Attending: Obstetrics & Gynecology | Admitting: Obstetrics & Gynecology

## 2019-12-31 ENCOUNTER — Other Ambulatory Visit: Payer: Self-pay

## 2019-12-31 LAB — CBC
HCT: 35.7 % — ABNORMAL LOW (ref 36.0–46.0)
Hemoglobin: 11 g/dL — ABNORMAL LOW (ref 12.0–15.0)
MCH: 22.6 pg — ABNORMAL LOW (ref 26.0–34.0)
MCHC: 30.8 g/dL (ref 30.0–36.0)
MCV: 73.3 fL — ABNORMAL LOW (ref 80.0–100.0)
Platelets: 187 10*3/uL (ref 150–400)
RBC: 4.87 MIL/uL (ref 3.87–5.11)
RDW: 14.9 % (ref 11.5–15.5)
WBC: 6.3 10*3/uL (ref 4.0–10.5)
nRBC: 0 % (ref 0.0–0.2)

## 2019-12-31 LAB — TYPE AND SCREEN
ABO/RH(D): A POS
Antibody Screen: NEGATIVE

## 2019-12-31 LAB — SARS CORONAVIRUS 2 (TAT 6-24 HRS): SARS Coronavirus 2: NEGATIVE

## 2019-12-31 LAB — ABO/RH: ABO/RH(D): A POS

## 2019-12-31 LAB — RPR: RPR Ser Ql: NONREACTIVE

## 2019-12-31 NOTE — Progress Notes (Signed)
Presents for Covid-19 testing & lab work prior to surgery.  Denies any symptoms.  Tolerated testing well.  Pt given pre-op instructions & skin prep.

## 2020-01-01 NOTE — Anesthesia Preprocedure Evaluation (Addendum)
Anesthesia Evaluation  Patient identified by MRN, date of birth, ID band Patient awake    Reviewed: Allergy & Precautions, NPO status , Patient's Chart, lab work & pertinent test results  Airway Mallampati: II  TM Distance: >3 FB Neck ROM: Full    Dental no notable dental hx. (+) Teeth Intact   Pulmonary neg pulmonary ROS,    Pulmonary exam normal breath sounds clear to auscultation       Cardiovascular negative cardio ROS Normal cardiovascular exam Rhythm:Regular Rate:Normal     Neuro/Psych negative neurological ROS  negative psych ROS   GI/Hepatic negative GI ROS, Neg liver ROS,   Endo/Other  negative endocrine ROS  Renal/GU negative Renal ROS     Musculoskeletal negative musculoskeletal ROS (+)   Abdominal (+) - obese,   Peds  Hematology Lab Results      Component                Value               Date                      WBC                      6.3                 12/31/2019                HGB                      11.0 (L)            12/31/2019                HCT                      35.7 (L)            12/31/2019                MCV                      73.3 (L)            12/31/2019                PLT                      187                 12/31/2019            T&S available   Anesthesia Other Findings   Reproductive/Obstetrics (+) Pregnancy                            Anesthesia Physical Anesthesia Plan  ASA: II  Anesthesia Plan: Spinal   Post-op Pain Management:    Induction:   PONV Risk Score and Plan: 3 and Treatment may vary due to age or medical condition, Ondansetron and Dexamethasone  Airway Management Planned: Nasal Cannula and Natural Airway  Additional Equipment: None  Intra-op Plan:   Post-operative Plan:   Informed Consent: I have reviewed the patients History and Physical, chart, labs and discussed the procedure including the risks, benefits and  alternatives for the proposed anesthesia with the patient or authorized representative who has indicated his/her understanding and  acceptance.     Dental advisory given  Plan Discussed with: CRNA  Anesthesia Plan Comments: ( 39.5 wk G3P2 for Rpt c/s x 3 under spinal)       Anesthesia Quick Evaluation

## 2020-01-02 ENCOUNTER — Inpatient Hospital Stay (HOSPITAL_COMMUNITY): Payer: No Typology Code available for payment source | Admitting: Anesthesiology

## 2020-01-02 ENCOUNTER — Other Ambulatory Visit: Payer: Self-pay

## 2020-01-02 ENCOUNTER — Encounter (HOSPITAL_COMMUNITY)
Admission: RE | Disposition: A | Discharge status: Home / Self Care | Payer: Self-pay | Source: Home / Self Care | Attending: Obstetrics and Gynecology

## 2020-01-02 ENCOUNTER — Inpatient Hospital Stay (HOSPITAL_COMMUNITY)
Admission: RE | Admit: 2020-01-02 | Discharge: 2020-01-04 | DRG: 785 | Disposition: A | Discharge status: Home / Self Care | Payer: No Typology Code available for payment source | Attending: Obstetrics and Gynecology | Admitting: Obstetrics and Gynecology

## 2020-01-02 ENCOUNTER — Encounter (HOSPITAL_COMMUNITY): Payer: Self-pay | Admitting: Obstetrics and Gynecology

## 2020-01-02 DIAGNOSIS — Z302 Encounter for sterilization: Secondary | ICD-10-CM

## 2020-01-02 DIAGNOSIS — N736 Female pelvic peritoneal adhesions (postinfective): Secondary | ICD-10-CM | POA: Diagnosis present

## 2020-01-02 DIAGNOSIS — O99892 Other specified diseases and conditions complicating childbirth: Secondary | ICD-10-CM | POA: Diagnosis present

## 2020-01-02 DIAGNOSIS — Z20822 Contact with and (suspected) exposure to covid-19: Secondary | ICD-10-CM | POA: Diagnosis present

## 2020-01-02 DIAGNOSIS — O34211 Maternal care for low transverse scar from previous cesarean delivery: Secondary | ICD-10-CM | POA: Diagnosis present

## 2020-01-02 DIAGNOSIS — Z3A39 39 weeks gestation of pregnancy: Secondary | ICD-10-CM

## 2020-01-02 SURGERY — Surgical Case
Anesthesia: Spinal | Wound class: Clean

## 2020-01-02 MED ORDER — NALBUPHINE HCL 10 MG/ML IJ SOLN: 5.0000 mg | INTRAMUSCULAR | Status: DC | PRN

## 2020-01-02 MED ORDER — SIMETHICONE 80 MG PO CHEW
80.0000 mg | CHEWABLE_TABLET | Freq: Three times a day (TID) | ORAL | Status: DC
  Administered 2020-01-02 – 2020-01-04 (×6): 80 mg via ORAL
  Filled 2020-01-02 (×6): qty 1

## 2020-01-02 MED ORDER — PHENYLEPHRINE HCL-NACL 20-0.9 MG/250ML-% IV SOLN
INTRAVENOUS | Status: DC | PRN
  Administered 2020-01-02: 80 ug/min via INTRAVENOUS

## 2020-01-02 MED ORDER — PHENYLEPHRINE HCL-NACL 20-0.9 MG/250ML-% IV SOLN
INTRAVENOUS | Status: AC
  Filled 2020-01-02: qty 250

## 2020-01-02 MED ORDER — OXYCODONE HCL 5 MG PO TABS: 5.0000 mg | ORAL_TABLET | Freq: Once | ORAL | Status: DC | PRN

## 2020-01-02 MED ORDER — STERILE WATER FOR IRRIGATION IR SOLN
Status: DC | PRN
  Administered 2020-01-02: 1

## 2020-01-02 MED ORDER — ONDANSETRON HCL 4 MG/2ML IJ SOLN
INTRAMUSCULAR | Status: DC | PRN
  Administered 2020-01-02: 4 mg via INTRAVENOUS

## 2020-01-02 MED ORDER — SIMETHICONE 80 MG PO CHEW
80.0000 mg | CHEWABLE_TABLET | ORAL | Status: DC
  Administered 2020-01-02 – 2020-01-03 (×2): 80 mg via ORAL
  Filled 2020-01-02 (×2): qty 1

## 2020-01-02 MED ORDER — DIBUCAINE (PERIANAL) 1 % EX OINT: 1.0000 "application " | TOPICAL_OINTMENT | CUTANEOUS | Status: DC | PRN

## 2020-01-02 MED ORDER — METHYLERGONOVINE MALEATE 0.2 MG/ML IJ SOLN: 0.2000 mg | INTRAMUSCULAR | Status: DC | PRN

## 2020-01-02 MED ORDER — CEFAZOLIN SODIUM-DEXTROSE 2-4 GM/100ML-% IV SOLN
2.0000 g | INTRAVENOUS | Status: AC
  Administered 2020-01-02: 2 g via INTRAVENOUS

## 2020-01-02 MED ORDER — WITCH HAZEL-GLYCERIN EX PADS: 1.0000 "application " | MEDICATED_PAD | CUTANEOUS | Status: DC | PRN

## 2020-01-02 MED ORDER — DEXAMETHASONE SODIUM PHOSPHATE 10 MG/ML IJ SOLN
INTRAMUSCULAR | Status: AC
  Filled 2020-01-02: qty 1

## 2020-01-02 MED ORDER — NALBUPHINE HCL 10 MG/ML IJ SOLN
5.0000 mg | Freq: Once | INTRAMUSCULAR | Status: AC | PRN
  Administered 2020-01-02: 5 mg via INTRAVENOUS

## 2020-01-02 MED ORDER — METHYLERGONOVINE MALEATE 0.2 MG PO TABS: 0.2000 mg | ORAL_TABLET | ORAL | Status: DC | PRN

## 2020-01-02 MED ORDER — BUPIVACAINE IN DEXTROSE 0.75-8.25 % IT SOLN
INTRATHECAL | Status: DC | PRN
  Administered 2020-01-02: 12 mg via INTRATHECAL

## 2020-01-02 MED ORDER — FENTANYL CITRATE (PF) 100 MCG/2ML IJ SOLN
INTRAMUSCULAR | Status: DC | PRN
  Administered 2020-01-02: 15 ug via INTRATHECAL

## 2020-01-02 MED ORDER — DIPHENHYDRAMINE HCL 50 MG/ML IJ SOLN: 12.5000 mg | INTRAMUSCULAR | Status: DC | PRN

## 2020-01-02 MED ORDER — NALOXONE HCL 0.4 MG/ML IJ SOLN: 0.4000 mg | INTRAMUSCULAR | Status: DC | PRN

## 2020-01-02 MED ORDER — ZOLPIDEM TARTRATE 5 MG PO TABS: 5.0000 mg | ORAL_TABLET | Freq: Every evening | ORAL | Status: DC | PRN

## 2020-01-02 MED ORDER — PHENYLEPHRINE 40 MCG/ML (10ML) SYRINGE FOR IV PUSH (FOR BLOOD PRESSURE SUPPORT)
PREFILLED_SYRINGE | INTRAVENOUS | Status: AC
  Filled 2020-01-02: qty 10

## 2020-01-02 MED ORDER — NALBUPHINE HCL 10 MG/ML IJ SOLN: 5.0000 mg | Freq: Once | INTRAMUSCULAR | Status: AC | PRN

## 2020-01-02 MED ORDER — ONDANSETRON HCL 4 MG/2ML IJ SOLN
INTRAMUSCULAR | Status: AC
  Filled 2020-01-02: qty 2

## 2020-01-02 MED ORDER — DIPHENHYDRAMINE HCL 25 MG PO CAPS: 25.0000 mg | ORAL_CAPSULE | ORAL | Status: DC | PRN

## 2020-01-02 MED ORDER — OXYTOCIN-SODIUM CHLORIDE 30-0.9 UT/500ML-% IV SOLN: 2.5000 [IU]/h | INTRAVENOUS | Status: AC

## 2020-01-02 MED ORDER — KETOROLAC TROMETHAMINE 30 MG/ML IJ SOLN
30.0000 mg | Freq: Once | INTRAMUSCULAR | Status: AC | PRN
  Administered 2020-01-02: 30 mg via INTRAVENOUS

## 2020-01-02 MED ORDER — OXYTOCIN-SODIUM CHLORIDE 30-0.9 UT/500ML-% IV SOLN
INTRAVENOUS | Status: AC
  Filled 2020-01-02: qty 500

## 2020-01-02 MED ORDER — OXYCODONE HCL 5 MG PO TABS
5.0000 mg | ORAL_TABLET | ORAL | Status: DC | PRN
  Administered 2020-01-03 – 2020-01-04 (×2): 5 mg via ORAL
  Filled 2020-01-02 (×2): qty 1

## 2020-01-02 MED ORDER — LACTATED RINGERS IV SOLN: INTRAVENOUS | Status: DC

## 2020-01-02 MED ORDER — SODIUM CHLORIDE 0.9 % IR SOLN
Status: DC | PRN
  Administered 2020-01-02: 1

## 2020-01-02 MED ORDER — SIMETHICONE 80 MG PO CHEW: 80.0000 mg | CHEWABLE_TABLET | ORAL | Status: DC | PRN

## 2020-01-02 MED ORDER — DIPHENHYDRAMINE HCL 25 MG PO CAPS: 25.0000 mg | ORAL_CAPSULE | Freq: Four times a day (QID) | ORAL | Status: DC | PRN

## 2020-01-02 MED ORDER — SCOPOLAMINE 1 MG/3DAYS TD PT72
1.0000 | MEDICATED_PATCH | Freq: Once | TRANSDERMAL | Status: DC
  Administered 2020-01-02: 1.5 mg via TRANSDERMAL

## 2020-01-02 MED ORDER — DEXAMETHASONE SODIUM PHOSPHATE 4 MG/ML IJ SOLN
INTRAMUSCULAR | Status: DC | PRN
  Administered 2020-01-02: 10 mg via INTRAVENOUS

## 2020-01-02 MED ORDER — SENNOSIDES-DOCUSATE SODIUM 8.6-50 MG PO TABS
2.0000 | ORAL_TABLET | ORAL | Status: DC
  Administered 2020-01-02 – 2020-01-03 (×2): 2 via ORAL
  Filled 2020-01-02 (×2): qty 2

## 2020-01-02 MED ORDER — IBUPROFEN 800 MG PO TABS
800.0000 mg | ORAL_TABLET | Freq: Three times a day (TID) | ORAL | Status: DC
  Administered 2020-01-02 – 2020-01-04 (×6): 800 mg via ORAL
  Filled 2020-01-02 (×6): qty 1

## 2020-01-02 MED ORDER — MORPHINE SULFATE (PF) 0.5 MG/ML IJ SOLN
INTRAMUSCULAR | Status: AC
  Filled 2020-01-02: qty 10

## 2020-01-02 MED ORDER — MORPHINE SULFATE (PF) 0.5 MG/ML IJ SOLN
INTRAMUSCULAR | Status: DC | PRN
  Administered 2020-01-02: 150 ug via INTRATHECAL

## 2020-01-02 MED ORDER — OXYCODONE HCL 5 MG/5ML PO SOLN: 5.0000 mg | Freq: Once | ORAL | Status: DC | PRN

## 2020-01-02 MED ORDER — NALOXONE HCL 4 MG/10ML IJ SOLN
1.0000 ug/kg/h | INTRAVENOUS | Status: DC | PRN
  Filled 2020-01-02: qty 5

## 2020-01-02 MED ORDER — KETOROLAC TROMETHAMINE 30 MG/ML IJ SOLN
INTRAMUSCULAR | Status: AC
  Filled 2020-01-02: qty 1

## 2020-01-02 MED ORDER — MENTHOL 3 MG MT LOZG: 1.0000 | LOZENGE | OROMUCOSAL | Status: DC | PRN

## 2020-01-02 MED ORDER — COCONUT OIL OIL: 1.0000 "application " | TOPICAL_OIL | Status: DC | PRN

## 2020-01-02 MED ORDER — TETANUS-DIPHTH-ACELL PERTUSSIS 5-2.5-18.5 LF-MCG/0.5 IM SUSP: 0.5000 mL | Freq: Once | INTRAMUSCULAR | Status: DC

## 2020-01-02 MED ORDER — FENTANYL CITRATE (PF) 100 MCG/2ML IJ SOLN
INTRAMUSCULAR | Status: AC
  Filled 2020-01-02: qty 2

## 2020-01-02 MED ORDER — PRENATAL MULTIVITAMIN CH
1.0000 | ORAL_TABLET | Freq: Every day | ORAL | Status: DC
  Administered 2020-01-03 – 2020-01-04 (×2): 1 via ORAL
  Filled 2020-01-02 (×2): qty 1

## 2020-01-02 MED ORDER — HYDROMORPHONE HCL 1 MG/ML IJ SOLN: 0.2500 mg | INTRAMUSCULAR | Status: DC | PRN

## 2020-01-02 MED ORDER — OXYTOCIN-SODIUM CHLORIDE 30-0.9 UT/500ML-% IV SOLN
INTRAVENOUS | Status: DC | PRN
  Administered 2020-01-02: 500 mL via INTRAVENOUS
  Administered 2020-01-02: 41.7 mL/h via INTRAVENOUS

## 2020-01-02 MED ORDER — METOCLOPRAMIDE HCL 5 MG/ML IJ SOLN
INTRAMUSCULAR | Status: AC
  Filled 2020-01-02: qty 2

## 2020-01-02 MED ORDER — NALBUPHINE HCL 10 MG/ML IJ SOLN
5.0000 mg | INTRAMUSCULAR | Status: DC | PRN
  Filled 2020-01-02: qty 1

## 2020-01-02 MED ORDER — SODIUM CHLORIDE 0.9% FLUSH: 3.0000 mL | INTRAVENOUS | Status: DC | PRN

## 2020-01-02 MED ORDER — ONDANSETRON HCL 4 MG/2ML IJ SOLN: 4.0000 mg | Freq: Three times a day (TID) | INTRAMUSCULAR | Status: DC | PRN

## 2020-01-02 MED ORDER — ONDANSETRON HCL 4 MG/2ML IJ SOLN: 4.0000 mg | Freq: Once | INTRAMUSCULAR | Status: DC | PRN

## 2020-01-02 MED ORDER — ACETAMINOPHEN 500 MG PO TABS
1000.0000 mg | ORAL_TABLET | Freq: Four times a day (QID) | ORAL | Status: DC
  Administered 2020-01-02 – 2020-01-04 (×8): 1000 mg via ORAL
  Filled 2020-01-02 (×8): qty 2

## 2020-01-02 MED ORDER — SCOPOLAMINE 1 MG/3DAYS TD PT72
MEDICATED_PATCH | TRANSDERMAL | Status: AC
  Filled 2020-01-02: qty 1

## 2020-01-02 MED ORDER — DEXAMETHASONE SODIUM PHOSPHATE 4 MG/ML IJ SOLN
INTRAMUSCULAR | Status: AC
  Filled 2020-01-02: qty 2

## 2020-01-02 MED ORDER — CEFAZOLIN SODIUM-DEXTROSE 2-4 GM/100ML-% IV SOLN
INTRAVENOUS | Status: AC
  Filled 2020-01-02: qty 100

## 2020-01-02 SURGICAL SUPPLY — 34 items
BARRIER ADHS 3X4 INTERCEED (GAUZE/BANDAGES/DRESSINGS) ×4 IMPLANT
CHLORAPREP W/TINT 26ML (MISCELLANEOUS) ×4 IMPLANT
CLAMP CORD UMBIL (MISCELLANEOUS) IMPLANT
CLOTH BEACON ORANGE TIMEOUT ST (SAFETY) ×4 IMPLANT
DERMABOND ADVANCED (GAUZE/BANDAGES/DRESSINGS) ×2
DERMABOND ADVANCED .7 DNX12 (GAUZE/BANDAGES/DRESSINGS) ×2 IMPLANT
DRSG OPSITE POSTOP 4X10 (GAUZE/BANDAGES/DRESSINGS) ×4 IMPLANT
ELECT REM PT RETURN 9FT ADLT (ELECTROSURGICAL) ×4
ELECTRODE REM PT RTRN 9FT ADLT (ELECTROSURGICAL) ×2 IMPLANT
EXTRACTOR VACUUM M CUP 4 TUBE (SUCTIONS) ×3 IMPLANT
EXTRACTOR VACUUM M CUP 4' TUBE (SUCTIONS) ×1
GLOVE BIO SURGEON STRL SZ7 (GLOVE) ×4 IMPLANT
GLOVE BIOGEL PI IND STRL 7.0 (GLOVE) ×2 IMPLANT
GLOVE BIOGEL PI INDICATOR 7.0 (GLOVE) ×2
GOWN STRL REUS W/TWL LRG LVL3 (GOWN DISPOSABLE) ×8 IMPLANT
KIT ABG SYR 3ML LUER SLIP (SYRINGE) IMPLANT
NEEDLE HYPO 22GX1.5 SAFETY (NEEDLE) IMPLANT
NEEDLE HYPO 25X5/8 SAFETYGLIDE (NEEDLE) IMPLANT
NS IRRIG 1000ML POUR BTL (IV SOLUTION) ×4 IMPLANT
PACK C SECTION WH (CUSTOM PROCEDURE TRAY) ×4 IMPLANT
PAD OB MATERNITY 4.3X12.25 (PERSONAL CARE ITEMS) ×4 IMPLANT
PENCIL SMOKE EVAC W/HOLSTER (ELECTROSURGICAL) ×4 IMPLANT
RTRCTR C-SECT PINK 25CM LRG (MISCELLANEOUS) ×4 IMPLANT
SUT CHROMIC 1 CTX 36 (SUTURE) ×12 IMPLANT
SUT CHROMIC 2 0 CT 1 (SUTURE) ×4 IMPLANT
SUT PDS AB 0 CTX 60 (SUTURE) ×4 IMPLANT
SUT PLAIN 0 NONE (SUTURE) ×8 IMPLANT
SUT VIC AB 2-0 CT1 27 (SUTURE) ×4
SUT VIC AB 2-0 CT1 TAPERPNT 27 (SUTURE) ×2 IMPLANT
SUT VIC AB 4-0 KS 27 (SUTURE) IMPLANT
SYR 30ML LL (SYRINGE) IMPLANT
TOWEL OR 17X24 6PK STRL BLUE (TOWEL DISPOSABLE) ×4 IMPLANT
TRAY FOLEY W/BAG SLVR 14FR LF (SET/KITS/TRAYS/PACK) ×4 IMPLANT
WATER STERILE IRR 1000ML POUR (IV SOLUTION) ×4 IMPLANT

## 2020-01-02 NOTE — H&P (Signed)
Megan Skinner is a 29 y.o. female presenting for repeat cesarean section  29 yo G3P2002 @39 +5 presents for repeat cesarean delivery. Her pregnancy has been otherwise uncomplicated OB History    Gravida  3   Para  2   Term  2   Preterm      AB      Living  2     SAB      TAB      Ectopic      Multiple  0   Live Births  2          Past Medical History:  Diagnosis Date  . Medical history non-contributory    Past Surgical History:  Procedure Laterality Date  . ABDOMINAL HERNIA REPAIR  2001  . CESAREAN SECTION N/A 11/29/2012   Procedure: CESAREAN SECTION;  Surgeon: 12/01/2012, MD;  Location: [facility];  Service: Obstetrics;  Laterality: N/A;  . CESAREAN SECTION N/A 07/05/2016   Procedure: CESAREAN SECTION;  Surgeon: 09/02/2016, MD;  Location: [facility];  Service: Obstetrics;  Laterality: N/A;  . HERNIA REPAIR     Family History: family history includes Anemia in her mother; Arthritis in her paternal grandfather; Cancer in her maternal grandmother; Depression in her maternal aunt; Diabetes in her father, maternal grandfather, maternal grandmother, and paternal grandfather; Heart attack in her maternal grandfather and paternal grandmother; Heart disease in her maternal grandfather and paternal grandmother; Hyperlipidemia in her maternal grandfather and maternal grandmother; Hypertension in her maternal grandmother, mother, and paternal grandmother; Stroke in her maternal grandfather. Social History:  reports that she has never smoked. She has never used smokeless tobacco. She reports previous alcohol use. She reports that she does not use drugs.     Maternal Diabetes: No Genetic Screening: Normal Maternal Ultrasounds/Referrals: Normal Fetal Ultrasounds or other Referrals:  None Maternal Substance Abuse:  No Significant Maternal Medications:  None Significant Maternal Lab Results:  None Other Comments:  None  Review of Systems History   Blood pressure  118/74, pulse 88, temperature 98.8 F (37.1 C), temperature source Oral, resp. rate 18, height 5\' 3"  (1.6 m), weight 89.4 kg, last menstrual period 02/20/2019, SpO2 100 %, unknown if currently breastfeeding. Exam Physical Exam  Prenatal labs: ABO, Rh: --/--/A POS, A POS Performed at [facility], [address], [place], [address] [place]  [phone]/29 [phone]) Antibody: NEG (06/29 0857) Rubella:  imm RPR: NON REACTIVE (06/29 0857)  HBsAg:   nr HIV:   nr GBS:     Assessment/Plan: 1) Admit 2) SCDs 3) consent for repeat c/s  4) Ancef 2 gms  10-04-1986 01/02/2020, 11:35 AM

## 2020-01-02 NOTE — Transfer of Care (Signed)
Immediate Anesthesia Transfer of Care Note  Patient: Megan Skinner  Procedure(s) Performed: CESAREAN SECTION WITH BILATERAL TUBAL LIGATION (Bilateral )  Patient Location: PACU  Anesthesia Type:Spinal  Level of Consciousness: awake, alert , oriented and patient cooperative  Airway & Oxygen Therapy: Patient Spontanous Breathing  Post-op Assessment: Report given to RN and Post -op Vital signs reviewed and stable  Post vital signs: Reviewed and stable  Last Vitals:  Vitals Value Taken Time  BP 99/64 01/02/20 1424  Temp 36.4 C 01/02/20 1424  Pulse 72 01/02/20 1425  Resp 15 01/02/20 1425  SpO2 100 % 01/02/20 1425  Vitals shown include unvalidated device data.  Last Pain:  Vitals:   01/02/20 1424  TempSrc: Oral         Complications: No complications documented.

## 2020-01-02 NOTE — Brief Op Note (Signed)
01/02/2020  2:11 PM  PATIENT:  Megan Skinner  29 y.o. female  PRE-OPERATIVE DIAGNOSIS:  repeat cesarean section and bilateral tubal ligation  POST-OPERATIVE DIAGNOSIS:  repeat cesarean section and bilateral tubal ligation  PROCEDURE:  Procedure(s): CESAREAN SECTION WITH BILATERAL TUBAL LIGATION (Bilateral)  SURGEON:  Surgeon(s) and Role:    Waynard Reeds, MD - Primary    * Taam-Akelman, Griselda Miner, MD - Assisting  PHYSICIAN ASSISTANT:   ASSISTANTS:    ANESTHESIA:   spinal  EBL:  Total I/O In: 2000 [I.V.:2000] Out: 742 [Urine:250; Blood:492]   BLOOD ADMINISTERED:none  DRAINS: Urinary Catheter (Foley)   LOCAL MEDICATIONS USED:  NONE  SPECIMEN:  placenta  DISPOSITION OF SPECIMEN:  disposal  COUNTS:  YES  TOURNIQUET:  * No tourniquets in log *  DICTATION: .Dragon Dictation  PLAN OF CARE: Admit to inpatient   PATIENT DISPOSITION:  PACU - hemodynamically stable.   Delay start of Pharmacological VTE agent (>24hrs) due to surgical blood loss or risk of bleeding: not applicable

## 2020-01-02 NOTE — Anesthesia Procedure Notes (Signed)
Spinal  Patient location during procedure: OB Start time: 01/02/2020 12:00 PM End time: 01/02/2020 12:53 PM Staffing Performed: anesthesiologist  Anesthesiologist: Trevor Iha, MD Preanesthetic Checklist Completed: patient identified, IV checked, risks and benefits discussed, surgical consent, monitors and equipment checked, pre-op evaluation and timeout performed Spinal Block Patient position: sitting Prep: DuraPrep and site prepped and draped Patient monitoring: heart rate, cardiac monitor, continuous pulse ox and blood pressure Approach: midline Location: L3-4 Injection technique: single-shot Needle Needle type: Pencan  Needle gauge: 24 G Needle length: 10 cm Needle insertion depth: 6 cm Assessment Sensory level: T4 Additional Notes 1 Attempt (s). Pt tolerated procedure well.

## 2020-01-02 NOTE — Lactation Note (Signed)
This note was copied from a baby's chart. Lactation Consultation Note  Patient Name: Megan Skinner LKGMW'N Date: 01/02/2020 Reason for consult: Initial assessment;1st time breastfeeding;Term P3, 4 hour term female infant. Infant had one void since birth. Per mom, this is infant's first time latching at breast. Per mom, she has DEBP at home. Per mom, she BF her 31 year old son for 4 months. Mom feels BF is going well no concerns at this time. LC entered room, mom had infant latched on her left breast using the cradle hold but no pillow support. LC assisted mom in using pillow support under infant and  for her arm to help with positioning and latch, infant had deep latch, nose and chin touching breast, top lip was flange out,  and swallows observed, infant BF without difficulty for 10 minutes. Afterwards Dad did STS with infant.  Mom knows to BF according to hunger cues, 8 to 12+ times within 24 hours and not exceed 3 hours without BF infant. Mom knows to call RN or LC if she needs assistance with latching infant at breast. Reviewed Baby & Me book's Breastfeeding Basics.  Mom made aware of O/P services, breastfeeding support groups, community resources, and our phone # for post-discharge questions.    Maternal Data Formula Feeding for Exclusion: No Has patient been taught Hand Expression?: Yes Does the patient have breastfeeding experience prior to this delivery?: Yes  Feeding Feeding Type: Breast Fed  LATCH Score Latch: Grasps breast easily, tongue down, lips flanged, rhythmical sucking.  Audible Swallowing: Spontaneous and intermittent  Type of Nipple: Everted at rest and after stimulation  Comfort (Breast/Nipple): Soft / non-tender  Hold (Positioning): Assistance needed to correctly position infant at breast and maintain latch.  LATCH Score: 9  Interventions Interventions: Breast feeding basics reviewed;Assisted with latch;Breast compression;Skin to skin;Adjust  position;Breast massage;Support pillows;Hand express;Position options;Expressed milk  Lactation Tools Discussed/Used WIC Program: No   Consult Status Consult Status: Follow-up Date: 01/03/20 Follow-up type: In-patient    Danelle Earthly 01/02/2020, 6:21 PM

## 2020-01-03 LAB — CBC
HCT: 32.6 % — ABNORMAL LOW (ref 36.0–46.0)
Hemoglobin: 9.9 g/dL — ABNORMAL LOW (ref 12.0–15.0)
MCH: 22.7 pg — ABNORMAL LOW (ref 26.0–34.0)
MCHC: 30.4 g/dL (ref 30.0–36.0)
MCV: 74.6 fL — ABNORMAL LOW (ref 80.0–100.0)
Platelets: 187 10*3/uL (ref 150–400)
RBC: 4.37 MIL/uL (ref 3.87–5.11)
RDW: 15 % (ref 11.5–15.5)
WBC: 10.9 10*3/uL — ABNORMAL HIGH (ref 4.0–10.5)
nRBC: 0 % (ref 0.0–0.2)

## 2020-01-03 LAB — SURGICAL PATHOLOGY

## 2020-01-03 LAB — BIRTH TISSUE RECOVERY COLLECTION (PLACENTA DONATION)

## 2020-01-03 NOTE — Anesthesia Postprocedure Evaluation (Signed)
Anesthesia Post Note  Patient: Megan Skinner  Procedure(s) Performed: CESAREAN SECTION WITH BILATERAL TUBAL LIGATION (Bilateral )     Patient location during evaluation: Mother Baby Anesthesia Type: Spinal Level of consciousness: oriented and awake and alert Pain management: pain level controlled Vital Signs Assessment: post-procedure vital signs reviewed and stable Respiratory status: spontaneous breathing and respiratory function stable Cardiovascular status: blood pressure returned to baseline and stable Postop Assessment: no headache, no backache, no apparent nausea or vomiting and able to ambulate Anesthetic complications: no   No complications documented.  Last Vitals:  Vitals:   01/02/20 2256 01/03/20 0502  BP:  (!) 99/54  Pulse:  62  Resp: 16 18  Temp:  37 C  SpO2: 99% 99%    Last Pain:  Vitals:   01/03/20 0502  TempSrc: Axillary  PainSc:                  Trevor Iha

## 2020-01-03 NOTE — Anesthesia Postprocedure Evaluation (Signed)
Anesthesia Post Note  Patient: Megan Skinner  Procedure(s) Performed: CESAREAN SECTION WITH BILATERAL TUBAL LIGATION (Bilateral )     Patient location during evaluation: Mother Baby Anesthesia Type: Spinal Level of consciousness: awake and alert Pain management: pain level controlled Vital Signs Assessment: post-procedure vital signs reviewed and stable Respiratory status: spontaneous breathing, nonlabored ventilation and respiratory function stable Cardiovascular status: stable Postop Assessment: no headache, no backache and epidural receding Anesthetic complications: no   No complications documented.  Last Vitals:  Vitals:   01/03/20 0900 01/03/20 1546  BP: 93/68 (!) 107/55  Pulse: 66 72  Resp: 16 18  Temp: 36.7 C 36.7 C  SpO2: 99% 99%    Last Pain:  Vitals:   01/03/20 1651  TempSrc:   PainSc: 5    Pain Goal:                   Rica Records

## 2020-01-03 NOTE — Op Note (Signed)
Pre-Operative Diagnosis: 1) 39+5 week intrauterine pregnancy 2) Cesarean section x 2 3)Desired permanent  Postoperative Diagnosis: 1) 39+5 week intrauterine pregnancy 2) cesarean section x 2 3) desired permanent sterilization 4) Lysis of adhehions Procedure: Repeat low transverse cesarean section and bilateral tubal sterilization with partial salpingectomy Surgeon: Dr. Waynard Reeds Assistant: D. Rosie Taam-Akelman Operative Findings: Extensive adhesive disease involving the Anterior abdominal wall to the front wall of the uterus. Ovaries and tubes normal appearing. Vigorous Female infant in the vertex presentation with Apgar scores of eight at one minute and nine at 5 minutes Specimen: Bilateral tubal segments to pathology and placenta for disposal EBL: 517 cc  Procedure:[name] is an 29 year old gravida 3 para 2002 at 76 weeks and 5 days estimated gestational age who presents for cesarean section. Following the appropriate informed consent the patient was brought to the operating room where spinal anesthesia was administered and found to be adequate. She was placed in the dorsal supine position with a leftward tilt. She was prepped and draped in the normal sterile fashion. The patient was appropriately identified during a pre-operative time out procedure. Scalpel was then used to make a Pfannenstiel skin incision which was carried down to the underlying layers of soft tissue to the fascia. The fascia was incised in the midline and the fascial incision was extended laterally with Mayo scissors. The superior aspect of the fascial incision was grasped with Coker clamps x2, tented up and the rectus muscles dissected off sharply with the electrocautery unit The same procedure was repeated on the inferior aspect of the fascial incision. The rectus muscles were separated in the midline. With separation of the rectus muscles it was difficult to identify the abdominal peritoneum. It became clear that the uterus  was densely adhesed to the anterior abdominal wall. An extensive lysis of adhesions was required to restore normal anatomy and identify the lower uterine segment. The Alexis retractor was then deployed. Scalpel was then used to make a low transverse incision on the uterus which was extended laterally with blunt dissection. The fetal vertex was identified, delivered through the uterine incision with the assistance of a vacuum,  followed by the body. The infant was bulb suctioned on the operative field and cried vigorously. After a 1 minute delay, the cord was clamped and cut and the infant was passed to the waiting neonatologist. Placenta was then delivered spontaneously, the uterus was cleared of all clot and debris. The uterine incision was repaired with #1 chromic in running locked fashion. Ovaries and tubes were inspected and normal. Attention was turned to the left fallopian tube. The tube was tented up with a Babcock clamp, a clear space in the mesosalpinx was identified, And incised with the electrocautery unit. The proximal and distal portion of the fallopian tube were tied off with 2-0 plain gut ties x 2.  The intervening portion of fallopian tube was excised. The same procedure was repeated on the right hand side. The Alexis retractor was removed. The abdominal peritoneum was reapproximated with 2-0 Vicryl in a running fashion, the rectus muscles was reapproximated with 2-0 chromic in a running fashion. The fascia was closed with a looped PDS in a running fashion. The skin was closed with 4-0 vicryl in a subcuticular fashion and Dermabon. All sponge lap and needle counts were correct.

## 2020-01-03 NOTE — Addendum Note (Signed)
Addendum  created 01/03/20 1712 by Rica Records, CRNA   Charge Capture section accepted, Clinical Note Signed

## 2020-01-03 NOTE — Progress Notes (Signed)
Patient is doing well.  She is tolerating PO, ambulating.  Foley removed overnight and has recently voided.  Pain is controlled.  Lochia is appropriate  Vitals:   01/02/20 1935 01/02/20 2100 01/02/20 2256 01/03/20 0502  BP:    (!) 99/54  Pulse:    62  Resp: 16 18 16 18   Temp:  97.7 F (36.5 C)  98.6 F (37 C)  TempSrc:  Axillary  Axillary  SpO2: 99% 100% 99% 99%  Weight:      Height:        NAD Abdomen:  Exam deferred per patient request--baby sleeping on chest ext:    Symmetric, trace edema bilaterally  Lab Results  Component Value Date   WBC 10.9 (H) 01/03/2020   HGB 9.9 (L) 01/03/2020   HCT 32.6 (L) 01/03/2020   MCV 74.6 (L) 01/03/2020   PLT 187 01/03/2020    --/--/A POS, A POS Performed at [facility], [address], [place], [place]  (06/29 0857)/RImmune  A/P    29 y.o. G3P3003 POD 1 s/p RCS Routine post op and postpartum care.   Expect d/c tomorrow

## 2020-01-04 ENCOUNTER — Encounter (HOSPITAL_COMMUNITY): Payer: Self-pay | Admitting: Obstetrics and Gynecology

## 2020-01-04 MED ORDER — IBUPROFEN 600 MG PO TABS: 600.0000 mg | ORAL_TABLET | Freq: Four times a day (QID) | ORAL | 0 refills | Status: AC | PRN

## 2020-01-04 MED ORDER — OXYCODONE HCL 5 MG PO TABS: 5.0000 mg | ORAL_TABLET | ORAL | 0 refills | Status: AC | PRN

## 2020-01-04 NOTE — Progress Notes (Signed)
Patient is doing well.  She is tolerating PO, ambulating, voiding.  Pain is controlled.  Lochia is appropriate  Vitals:   01/03/20 0900 01/03/20 1546 01/03/20 2153 01/04/20 0500  BP: 93/68 (!) 107/55 (!) 101/59 99/65  Pulse: 66 72 69 70  Resp: 16 18  16   Temp: 98 F (36.7 C) 98 F (36.7 C) 98.6 F (37 C) 98.2 F (36.8 C)  TempSrc: Oral Oral Oral Oral  SpO2: 99% 99% 100%   Weight:      Height:        NAD Abdomen:  soft, appropriate tenderness, incisions intact and without erythema or drainage ext:    Symmetric, no edema bilaterally  Lab Results  Component Value Date   WBC 10.9 (H) 01/03/2020   HGB 9.9 (L) 01/03/2020   HCT 32.6 (L) 01/03/2020   MCV 74.6 (L) 01/03/2020   PLT 187 01/03/2020    --/--/A POS, A POS Performed at [facility], [address] [place], [place]  (06/29 0857)/RImmune  A/P    29 y.o. G3P3003 POD 2 s/p RCS Routine post op and postpartum care.   Meeting all goals, discharge to home

## 2020-01-04 NOTE — Discharge Summary (Signed)
Postpartum Discharge Summary      Patient Name: Megan Skinner DOB: Dec 03, 1990 MRN: [id number]  Date of admission: 01/02/2020 Delivery date:01/02/2020  Delivering provider: Waynard Reeds  Date of discharge: 01/04/2020  Admitting diagnosis: Encounter for maternal care for low transverse scar from repeat cesarean delivery [O34.211] Cesarean delivery, delivered, current hospitalization [O82] Intrauterine pregnancy: [redacted]w[redacted]d     Secondary diagnosis:  Active Problems:   Encounter for maternal care for low transverse scar from repeat cesarean delivery   Cesarean delivery, delivered, current hospitalization  Additional problems: None    Discharge diagnosis: Term Pregnancy Delivered                                              Post partum procedures:None Augmentation: N/A Complications: None  Hospital course: Sceduled C/S   29 y.o. yo G3P3003 at [redacted]w[redacted]d was admitted to the hospital 01/02/2020 for scheduled cesarean section with the following indication:Elective Repeat.Delivery details are as follows:  Membrane Rupture Time/Date: 1:22 PM ,01/02/2020   Delivery Method:C-Section, Vacuum Assisted  Details of operation can be found in separate operative note.  Patient had an uncomplicated postpartum course.  She is ambulating, tolerating a regular diet, passing flatus, and urinating well. Patient is discharged home in stable condition on  01/04/20        Newborn Data: Birth date:01/02/2020  Birth time:1:24 PM  Gender:Female  Living status:Living  Apgars:8 ,9  Weight:3810 g       Physical exam  Vitals:   01/03/20 0900 01/03/20 1546 01/03/20 2153 01/04/20 0500  BP: 93/68 (!) 107/55 (!) 101/59 99/65  Pulse: 66 72 69 70  Resp: 16 18  16   Temp: 98 F (36.7 C) 98 F (36.7 C) 98.6 F (37 C) 98.2 F (36.8 C)  TempSrc: Oral Oral Oral Oral  SpO2: 99% 99% 100%   Weight:      Height:       General: alert, cooperative and no distress Lochia: appropriate Uterine Fundus: firm Incision: Healing well  with no significant drainage, No significant erythema, Dressing is clean, dry, and intact DVT Evaluation: No evidence of DVT seen on physical exam. Labs: Lab Results  Component Value Date   WBC 10.9 (H) 01/03/2020   HGB 9.9 (L) 01/03/2020   HCT 32.6 (L) 01/03/2020   MCV 74.6 (L) 01/03/2020   PLT 187 01/03/2020   CMP Latest Ref Rng & Units 03/25/2019  Glucose 65 - 99 mg/dL 80  BUN 6 - 20 mg/dL 10  Creatinine 03/27/2019 - 9.21 mg/dL 1.94  Sodium 1.74 - 081 mmol/L 141  Potassium 3.5 - 5.2 mmol/L 4.2  Chloride 96 - 106 mmol/L 101  CO2 20 - 29 mmol/L 22  Calcium 8.7 - 10.2 mg/dL 9.2  Total Protein 6.0 - 8.5 g/dL 7.7  Total Bilirubin 0.0 - 1.2 mg/dL 0.5  Alkaline Phos 39 - 117 IU/L 46  AST 0 - 40 IU/L 19  ALT 0 - 32 IU/L 13   Edinburgh Score: Edinburgh Postnatal Depression Scale Screening Tool 01/04/2020  I have been able to laugh and see the funny side of things. 0  I have looked forward with enjoyment to things. 0  I have blamed myself unnecessarily when things went wrong. 1  I have been anxious or worried for no good reason. 1  I have felt scared or panicky for no good reason. 1  Things have been getting on top of me. 1  I have been so unhappy that I have had difficulty sleeping. 0  I have felt sad or miserable. 0  I have been so unhappy that I have been crying. 1  The thought of harming myself has occurred to me. 0  Edinburgh Postnatal Depression Scale Total 5      After visit meds:  Allergies as of 01/04/2020   No Known Allergies     Medication List    TAKE these medications   ibuprofen 600 MG tablet Commonly known as: ADVIL Take 1 tablet (600 mg total) by mouth every 6 (six) hours as needed.   oxyCODONE 5 MG immediate release tablet Commonly known as: Oxy IR/ROXICODONE Take 1-2 tablets (5-10 mg total) by mouth every 4 (four) hours as needed for moderate pain.   prenatal multivitamin Tabs tablet Take 1 tablet by mouth daily at 12 noon.            Discharge Care  Instructions  (From admission, onward)         Start     Ordered   01/04/20 0000  No dressing needed        01/04/20 0813           Discharge home in stable condition Infant Feeding: Breast Infant Disposition:home with mother Discharge instruction: per After Visit Summary and Postpartum booklet. Activity: Advance as tolerated. Pelvic rest for 6 weeks.  Diet: routine diet Anticipated Birth Control: BTL Postpartum Appointment:4 weeks  Future Appointments:No future appointments. Follow up Visit:      01/04/2020 [facility] GEFFEL Chestine Spore, MD

## 2020-01-04 NOTE — Lactation Note (Signed)
This note was copied from a baby's chart. Lactation Consultation Note Baby 68 hrs old. Mom stated baby is cluster feeding. Mom's breast are getting engorged. Hard knots and cords in breast noted. Breast are full. Breast massage slightly tender. Everted nipple rolled in finger tips to soften for latching easier. Baby BF great. Swallows heard. Encouraged breast massage to release milk to flow. Noted significant softening. Hand pump used on opposite breast that baby was BF on. Colostrum flowing well. Discussed w/mom using DEBP since breast are filling. Mom in agreement to pump. Mom shown how to use DEBP & how to disassemble, clean, & reassemble parts. Mom knows to pump q3h for 15-20 min. Discussed w/mom importance of assessing breast. May need to pump in 2 hrs if breast are getting full. Mom has Medela at home as well. Pumped 10 ml colostrum. FOB gave to baby in bottle w/green nipple. Mom plans on breast/bottle of BM. Mom will pump at home and give baby BM as well as BF. Mom has to return back to work in Oct. [place] to build [employer], fashion/clothing.  When mom finished pumping, LC laid mom back, applied cloth then 2 ice bags. Encouraged mom to alternate ice bag around her breast leaving on for 20 min. Laying back for 30 min. To 1 hr. FOB caring for baby at this time. Mom stated breast feel a lot better.  Reported to RN of consult.  Patient Name: Megan Skinner Date: 01/04/2020 Reason for consult: Follow-up assessment;Term;Engorgement   Maternal Data    Feeding Feeding Type: Breast Milk  LATCH Score Latch: Grasps breast easily, tongue down, lips flanged, rhythmical sucking.  Audible Swallowing: Spontaneous and intermittent  Type of Nipple: Everted at rest and after stimulation  Comfort (Breast/Nipple): Engorged, cracked, bleeding, large blisters, severe discomfort  Hold (Positioning): Assistance needed to correctly position infant at breast and maintain latch.  LATCH Score:  7  Interventions Interventions: Breast feeding basics reviewed;Support pillows;Assisted with latch;Position options;Skin to skin;Expressed milk;Breast massage;Hand express;Breast compression;Adjust position;DEBP;Hand pump  Lactation Tools Discussed/Used Tools: Pump;Bottle Breast pump type: Double-Electric Breast Pump;Manual Pump Review: Setup, frequency, and cleaning;Milk Storage Initiated by:: Peri Jefferson RN IBCLC Date initiated:: 01/04/20   Consult Status Consult Status: Follow-up Date: 01/04/20 Follow-up type: In-patient    Charyl Dancer 01/04/2020, 5:13 AM

## 2020-01-04 NOTE — Lactation Note (Signed)
This note was copied from a baby's chart. Lactation Consultation Note Attempted to see mom, mom sleeping. FOB watching baby.  Patient Name: Megan Skinner HOOIL'N Date: 01/04/2020     Maternal Data    Feeding Feeding Type: Breast Fed  LATCH Score                   Interventions    Lactation Tools Discussed/Used     Consult Status      [name] 01/04/2020, 2:22 AM

## 2022-11-05 ENCOUNTER — Emergency Department (HOSPITAL_COMMUNITY)
Admission: EM | Admit: 2022-11-05 | Discharge: 2022-11-05 | Disposition: A | Discharge status: Home / Self Care | Payer: No Typology Code available for payment source | Attending: Emergency Medicine | Admitting: Emergency Medicine

## 2022-11-05 ENCOUNTER — Other Ambulatory Visit: Payer: Self-pay

## 2022-11-05 DIAGNOSIS — Y92410 Unspecified street and highway as the place of occurrence of the external cause: Secondary | ICD-10-CM | POA: Insufficient documentation

## 2022-11-05 DIAGNOSIS — M542 Cervicalgia: Secondary | ICD-10-CM | POA: Insufficient documentation

## 2022-11-05 MED ORDER — OXYCODONE-ACETAMINOPHEN 5-325 MG PO TABS
1.0000 | ORAL_TABLET | Freq: Once | ORAL | Status: AC
  Administered 2022-11-05: 1 via ORAL
  Filled 2022-11-05: qty 1

## 2022-11-05 MED ORDER — OXYCODONE-ACETAMINOPHEN 5-325 MG PO TABS: 1.0000 | ORAL_TABLET | Freq: Four times a day (QID) | ORAL | 0 refills | Status: DC | PRN

## 2022-11-05 MED ORDER — IBUPROFEN 800 MG PO TABS
800.0000 mg | ORAL_TABLET | Freq: Once | ORAL | Status: AC
  Administered 2022-11-05: 800 mg via ORAL
  Filled 2022-11-05: qty 1

## 2022-11-05 MED ORDER — IBUPROFEN 600 MG PO TABS: 600.0000 mg | ORAL_TABLET | Freq: Four times a day (QID) | ORAL | 0 refills | Status: AC | PRN

## 2022-11-05 MED ORDER — IBUPROFEN 600 MG PO TABS: 600.0000 mg | ORAL_TABLET | Freq: Four times a day (QID) | ORAL | 0 refills | Status: DC | PRN

## 2022-11-05 MED ORDER — OXYCODONE-ACETAMINOPHEN 5-325 MG PO TABS: 1.0000 | ORAL_TABLET | Freq: Four times a day (QID) | ORAL | 0 refills | Status: AC | PRN
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
## 2023-02-03 ENCOUNTER — Ambulatory Visit
Admission: EM | Admit: 2023-02-03 | Discharge: 2023-02-03 | Disposition: A | Discharge status: Home / Self Care | Payer: No Typology Code available for payment source | Attending: Physician Assistant | Admitting: Physician Assistant

## 2023-02-03 DIAGNOSIS — R591 Generalized enlarged lymph nodes: Secondary | ICD-10-CM

## 2023-02-03 NOTE — ED Provider Notes (Signed)
[facility]    CSN: [id number] Arrival date & time: 02/03/23  1603      History   Chief Complaint Chief Complaint  Patient presents with   Lymphadenopathy    HPI Megan Skinner is a 32 y.o. female.   Here today for evaluation of swollen lymph node to her right posterior neck that she noticed several months ago.  She states the area seems to have gotten a little bit larger recently.  She denies any pain associated with lymph node.  She has not had any fever.  She denies any unexplained weight loss.  The history is provided by the patient.    Past Medical History:  Diagnosis Date   Medical history non-contributory     Patient Active Problem List   Diagnosis Date Noted   Encounter for maternal care for low transverse scar from repeat cesarean delivery 01/02/2020   Cesarean delivery, delivered, current hospitalization 01/02/2020   S/P cesarean section 12/02/2012    Past Surgical History:  Procedure Laterality Date   ABDOMINAL HERNIA REPAIR  2001   CESAREAN SECTION N/A 11/29/2012   Procedure: CESAREAN SECTION;  Surgeon: Bing Plume, MD;  Location: [facility];  Service: Obstetrics;  Laterality: N/A;   CESAREAN SECTION N/A 07/05/2016   Procedure: CESAREAN SECTION;  Surgeon: Gerald Leitz, MD;  Location: [facility];  Service: Obstetrics;  Laterality: N/A;   CESAREAN SECTION WITH BILATERAL TUBAL LIGATION Bilateral 01/02/2020   Procedure: CESAREAN SECTION WITH BILATERAL TUBAL LIGATION;  Surgeon: Waynard Reeds, MD;  Location: [facility];  Service: Obstetrics;  Laterality: Bilateral;   HERNIA REPAIR      OB History     Gravida  3   Para  3   Term  3   Preterm      AB      Living  3      SAB      IAB      Ectopic      Multiple  0   Live Births  3            Home Medications    Prior to Admission medications   Medication Sig Start Date End Date Taking? Authorizing Provider  ibuprofen (ADVIL) 600 MG tablet Take 1 tablet (600 mg total) by  mouth every 6 (six) hours as needed. 01/04/20   Marlow Baars, MD  ibuprofen (ADVIL) 600 MG tablet Take 1 tablet (600 mg total) by mouth every 6 (six) hours as needed. 11/05/22   Lorre Nick, MD  oxyCODONE (OXY IR/ROXICODONE) 5 MG immediate release tablet Take 1-2 tablets (5-10 mg total) by mouth every 4 (four) hours as needed for moderate pain. 01/04/20   Marlow Baars, MD  oxyCODONE-acetaminophen (PERCOCET/ROXICET) 5-325 MG tablet Take 1 tablet by mouth every 6 (six) hours as needed for severe pain. 11/05/22   Lorre Nick, MD  Prenatal Vit-Fe Fumarate-FA (PRENATAL MULTIVITAMIN) TABS tablet Take 1 tablet by mouth daily at 12 noon.    [provider]    Family History Family History  Problem Relation Age of Onset   Diabetes Father    Diabetes Maternal Grandmother    Hyperlipidemia Maternal Grandmother    Cancer Maternal Grandmother        peritoneal, ovarian   Hypertension Maternal Grandmother    Diabetes Maternal Grandfather    Hyperlipidemia Maternal Grandfather    Stroke Maternal Grandfather    Heart disease Maternal Grandfather    Heart attack Maternal Grandfather  Arthritis Paternal Grandfather    Diabetes Paternal Grandfather    Anemia Mother    Hypertension Mother    Heart disease Paternal Grandmother    Hypertension Paternal Grandmother    Heart attack Paternal Grandmother    Depression Maternal Aunt    Hearing loss Neg Hx     Social History Social History   Tobacco Use   Smoking status: Never   Smokeless tobacco: Never  Vaping Use   Vaping status: Never Used  Substance Use Topics   Alcohol use: Not Currently    Comment: occ   Drug use: No     Allergies   Patient has no known allergies.   Review of Systems Review of Systems  Constitutional:  Negative for chills, fever and unexpected weight change.  HENT:  Negative for congestion and sore throat.   Eyes:  Negative for discharge and redness.  Respiratory:  Negative for shortness of breath.    Gastrointestinal:  Negative for nausea and vomiting.     Physical Exam Triage Vital Signs ED Triage Vitals [02/03/23 1623]  Encounter Vitals Group     BP (!) 147/88     Systolic BP Percentile      Diastolic BP Percentile      Pulse Rate 87     Resp 16     Temp 98.4 F (36.9 C)     Temp Source Oral     SpO2 98 %     Weight      Height      Head Circumference      Peak Flow      Pain Score 0     Pain Loc      Pain Education      Exclude from Growth Chart    No data found.  Updated Vital Signs BP (!) 147/88 (BP Location: Left Arm)   Pulse 87   Temp 98.4 F (36.9 C) (Oral)   Resp 16   LMP 01/24/2023 (Exact Date)   SpO2 98%      Physical Exam Vitals and nursing note reviewed.  Constitutional:      General: She is not in acute distress.    Appearance: Normal appearance. She is not ill-appearing.  HENT:     Head: Normocephalic and atraumatic.  Eyes:     Conjunctiva/sclera: Conjunctivae normal.  Neck:     Comments: Single lymph node approximately 1 cm in diameter palpated to right posterior cervical chain Cardiovascular:     Rate and Rhythm: Normal rate.  Pulmonary:     Effort: Pulmonary effort is normal. No respiratory distress.  Neurological:     Mental Status: She is alert.  Psychiatric:        Mood and Affect: Mood normal.        Behavior: Behavior normal.        Thought Content: Thought content normal.      UC Treatments / Results  Labs (all labs ordered are listed, but only abnormal results are displayed) Labs Reviewed  CBC WITH DIFFERENTIAL/PLATELET  COMPREHENSIVE METABOLIC PANEL    EKG   Radiology No results found.  Procedures Procedures (including critical care time)  Medications Ordered in UC Medications - No data to display  Initial Impression / Assessment and Plan / UC Course  I have reviewed the triage vital signs and the nursing notes.  Pertinent labs & imaging results that were available during my care of the patient were  reviewed by me and considered in my medical decision  making (see chart for details).    Routine labs ordered for further evaluation.  Patient does report she has appointment for primary care evaluation next week.  Advise she keep this appointment as she may benefit from ultrasound for further evaluation.  Final Clinical Impressions(s) / UC Diagnoses   Final diagnoses:  Lymphadenopathy   Discharge Instructions   None    ED Prescriptions   None    PDMP not reviewed this encounter.   Tomi Bamberger, PA-C 02/03/23 1911

## 2023-02-03 NOTE — ED Triage Notes (Signed)
Pt states swollen gland on the right side of her neck since June 2024.  States last night she noticed it was getting bigger.

## 2023-10-03 ENCOUNTER — Encounter (HOSPITAL_BASED_OUTPATIENT_CLINIC_OR_DEPARTMENT_OTHER): Payer: Self-pay | Admitting: Emergency Medicine

## 2023-10-03 ENCOUNTER — Emergency Department (HOSPITAL_BASED_OUTPATIENT_CLINIC_OR_DEPARTMENT_OTHER): Admitting: Radiology

## 2023-10-03 ENCOUNTER — Emergency Department (HOSPITAL_BASED_OUTPATIENT_CLINIC_OR_DEPARTMENT_OTHER)
Admission: EM | Admit: 2023-10-03 | Discharge: 2023-10-03 | Disposition: A | Discharge status: Home / Self Care | Attending: Emergency Medicine | Admitting: Emergency Medicine

## 2023-10-03 ENCOUNTER — Other Ambulatory Visit: Payer: Self-pay

## 2023-10-03 DIAGNOSIS — R079 Chest pain, unspecified: Secondary | ICD-10-CM | POA: Diagnosis not present

## 2023-10-03 DIAGNOSIS — M79604 Pain in right leg: Secondary | ICD-10-CM | POA: Diagnosis present

## 2023-10-03 DIAGNOSIS — R0602 Shortness of breath: Secondary | ICD-10-CM | POA: Insufficient documentation

## 2023-10-03 LAB — CBC WITH DIFFERENTIAL/PLATELET
Abs Immature Granulocytes: 0.01 10*3/uL (ref 0.00–0.07)
Basophils Absolute: 0 10*3/uL (ref 0.0–0.1)
Basophils Relative: 0 %
Eosinophils Absolute: 0 10*3/uL (ref 0.0–0.5)
Eosinophils Relative: 1 %
HCT: 38.1 % (ref 36.0–46.0)
Hemoglobin: 11.9 g/dL — ABNORMAL LOW (ref 12.0–15.0)
Immature Granulocytes: 0 %
Lymphocytes Relative: 39 %
Lymphs Abs: 2.3 10*3/uL (ref 0.7–4.0)
MCH: 23.1 pg — ABNORMAL LOW (ref 26.0–34.0)
MCHC: 31.2 g/dL (ref 30.0–36.0)
MCV: 73.8 fL — ABNORMAL LOW (ref 80.0–100.0)
Monocytes Absolute: 0.4 10*3/uL (ref 0.1–1.0)
Monocytes Relative: 7 %
Neutro Abs: 3 10*3/uL (ref 1.7–7.7)
Neutrophils Relative %: 53 %
Platelets: 216 10*3/uL (ref 150–400)
RBC: 5.16 MIL/uL — ABNORMAL HIGH (ref 3.87–5.11)
RDW: 15.4 % (ref 11.5–15.5)
WBC: 5.7 10*3/uL (ref 4.0–10.5)
nRBC: 0 % (ref 0.0–0.2)

## 2023-10-03 LAB — BASIC METABOLIC PANEL WITH GFR
Anion gap: 8 (ref 5–15)
BUN: 16 mg/dL (ref 6–20)
CO2: 25 mmol/L (ref 22–32)
Calcium: 8.8 mg/dL — ABNORMAL LOW (ref 8.9–10.3)
Chloride: 104 mmol/L (ref 98–111)
Creatinine, Ser: 0.84 mg/dL (ref 0.44–1.00)
GFR, Estimated: >60 mL/min (ref >60)
Glucose, Bld: 88 mg/dL (ref 70–99)
Potassium: 3.6 mmol/L (ref 3.5–5.1)
Sodium: 137 mmol/L (ref 135–145)

## 2023-10-03 LAB — TROPONIN I (HIGH SENSITIVITY): Troponin I (High Sensitivity): <2 ng/L (ref <18)

## 2023-10-03 LAB — PREGNANCY, URINE: Preg Test, Ur: NEGATIVE

## 2023-10-03 LAB — MAGNESIUM: Magnesium: 1.7 mg/dL (ref 1.7–2.4)

## 2023-10-03 LAB — D-DIMER, QUANTITATIVE: D-Dimer, Quant: 0.28 ug{FEU}/mL (ref 0.00–0.50)

## 2023-10-03 NOTE — Discharge Instructions (Signed)
 You were seen for your leg pain and chest pain in the emergency department.   At home, please take Tylenol and ibuprofen for your symptoms.    Check your MyChart online for the results of any tests that had not resulted by the time you left the emergency department.   Follow-up with your primary doctor in 2-3 days regarding your visit.  Follow-up with sports medicine about your leg within the next week.  Return immediately to the emergency department if you experience any of the following: Worsening chest pain, difficulty, or any other concerning symptoms.    Thank you for visiting our Emergency Department. It was a pleasure taking care of you today.

## 2023-10-03 NOTE — ED Provider Notes (Signed)
 [place] EMERGENC[facility] DEPARTMENT AT [facility] Provider Note   CSN: [id number] Arrival date & time: 10/03/23  1624     History {Add pertinent medical, surgical, social history, OB history to HPI:1} Chief Complaint  Patient presents with   Leg Pain    Megan Skinner is a 33 y.o. female.  33 year old female who presents emergency department with right lower extremity swelling and pain as well as chest pain and shortness of breath.  Patient reports that her right lower extremity has been painful over the past month.  Mostly in her calf.  No swelling until she returned from a long car ride from [place] several days ago.  Since that has been propping up her leg as well as massaging it but says that it feels like it is cramping.  Yesterday had a 2-minute episode of chest pain and shortness of breath.  Describes the chest pain as tightness across her upper chest.  3/10 in severity.  Also had some mild shortness of breath with it.  Says that it is resolved and not recurred.  No history of hormone use.  No recent pregnancies.  No history of cancer, DVT/PE, or surgery in the past month.       Home Medications Prior to Admission medications   Medication Sig Start Date End Date Taking? Authorizing Provider  ibuprofen (ADVIL) 600 MG tablet Take 1 tablet (600 mg total) by mouth every 6 (six) hours as needed. 01/04/20   Marlow Baars, MD  ibuprofen (ADVIL) 600 MG tablet Take 1 tablet (600 mg total) by mouth every 6 (six) hours as needed. 11/05/22   Lorre Nick, MD  oxyCODONE (OXY IR/ROXICODONE) 5 MG immediate release tablet Take 1-2 tablets (5-10 mg total) by mouth every 4 (four) hours as needed for moderate pain. 01/04/20   Marlow Baars, MD  oxyCODONE-acetaminophen (PERCOCET/ROXICET) 5-325 MG tablet Take 1 tablet by mouth every 6 (six) hours as needed for severe pain. 11/05/22   Lorre Nick, MD  Prenatal Vit-Fe Fumarate-FA (PRENATAL MULTIVITAMIN) TABS tablet Take 1 tablet by mouth daily at 12  noon.    [provider]      Allergies    Patient has no known allergies.    Review of Systems   Review of Systems  Physical Exam Updated Vital Signs BP 124/78 (BP Location: Right Arm)   Pulse 76   Temp 98.5 F (36.9 C)   Resp 18   LMP 09/30/2023   SpO2 100%  Physical Exam Vitals and nursing note reviewed.  Constitutional:      General: She is not in acute distress.    Appearance: She is well-developed.  HENT:     Head: Normocephalic and atraumatic.     Right Ear: External ear normal.     Left Ear: External ear normal.     Nose: Nose normal.  Eyes:     Extraocular Movements: Extraocular movements intact.     Conjunctiva/sclera: Conjunctivae normal.     Pupils: Pupils are equal, round, and reactive to light.  Cardiovascular:     Rate and Rhythm: Normal rate and regular rhythm.     Heart sounds: No murmur heard. Pulmonary:     Effort: Pulmonary effort is normal. No respiratory distress.     Breath sounds: Normal breath sounds.  Musculoskeletal:     Cervical back: Normal range of motion and neck supple.     Right lower leg: No edema.     Left lower leg: No edema.  Skin:  General: Skin is warm and dry.  Neurological:     Mental Status: She is alert and oriented to person, place, and time. Mental status is at baseline.  Psychiatric:        Mood and Affect: Mood normal.     ED Results / Procedures / Treatments   Labs (all labs ordered are listed, but only abnormal results are displayed) Labs Reviewed  D-DIMER, QUANTITATIVE    EKG None  Radiology No results found.  Procedures Procedures  {Document cardiac monitor, telemetry assessment procedure when appropriate:1}  Medications Ordered in ED Medications - No data to display  ED Course/ Medical Decision Making/ A&P   {   Click here for ABCD2, HEART and other calculatorsREFRESH Note before signing :1}                              Medical Decision Making Amount and/or Complexity of Data  Reviewed Labs: ordered. Radiology: ordered.   ***  {Document critical care time when appropriate:1} {Document review of labs and clinical decision tools ie heart score, Chads2Vasc2 etc:1}  {Document your independent review of radiology images, and any outside records:1} {Document your discussion with family members, caretakers, and with consultants:1} {Document social determinants of health affecting pt's care:1} {Document your decision making why or why not admission, treatments were needed:1} Final Clinical Impression(s) / ED Diagnoses Final diagnoses:  None    Rx / DC Orders ED Discharge Orders     None

## 2023-10-03 NOTE — ED Triage Notes (Signed)
 Pain in right leg/ calf x 1 month Recent travel/ long drive to [place] Notice some swelling.  Some sob and chest pains yesterday

## 2023-10-09 ENCOUNTER — Encounter: Payer: Self-pay | Admitting: Obstetrics and Gynecology

## 2023-10-09 NOTE — Progress Notes (Deleted)
 NEW GYNECOLOGY PATIENT Patient name: Megan Skinner MRN [id number]  Date of birth: 1991/06/26 Chief Complaint:   No chief complaint on file.     History:  Megan Skinner is a 33 y.o. G3P3003 being seen today for annual.         Gynecologic History Patient's last menstrual period was 09/30/2023. Contraception: {method:5051} Last Pap: 07/28/15 NILM Last Mammogram: n/a Last Colonoscopy: n/a  Obstetric History OB History  Gravida Para Term Preterm AB Living  3 3 3   3   SAB IAB Ectopic Multiple Live Births     0 3    # Outcome Date GA Lbr Len/2nd Weight Sex Type Anes PTL Lv  3 Term 01/02/20 [redacted]w[redacted]d  8 lb 6.4 oz (3.81 kg) F CS-Vac Spinal  LIV  2 Term 07/05/16 [redacted]w[redacted]d  8 lb 5.7 oz (3.79 kg) M CS-LTranv Spinal  LIV  1 Term 11/29/12 [redacted]w[redacted]d  7 lb 10.9 oz (3.485 kg) M CS-LTranv EPI  LIV    Past Medical History:  Diagnosis Date   Medical history non-contributory     Past Surgical History:  Procedure Laterality Date   ABDOMINAL HERNIA REPAIR  2001   CESAREAN SECTION N/A 11/29/2012   Procedure: CESAREAN SECTION;  Surgeon: Bing Plume, MD;  Location: [facility];  Service: Obstetrics;  Laterality: N/A;   CESAREAN SECTION N/A 07/05/2016   Procedure: CESAREAN SECTION;  Surgeon: Gerald Leitz, MD;  Location: [facility]TH[facility] SU[facility];  Service: Obstetrics;  Laterality: N/A;   CESAREAN SECTION WITH BILATERAL TUBAL LIGATION Bilateral 01/02/2020   Procedure: CESAREAN SECTION WITH BILATERAL TUBAL LIGATION;  Surgeon: Waynard Reeds, MD;  Location: [facility];  Service: Obstetrics;  Laterality: Bilateral;   HERNIA REPAIR      Current Outpatient Medications on File Prior to Visit  Medication Sig Dispense Refill   ibuprofen (ADVIL) 600 MG tablet Take 1 tablet (600 mg total) by mouth every 6 (six) hours as needed. 60 tablet 0   ibuprofen (ADVIL) 600 MG tablet Take 1 tablet (600 mg total) by mouth every 6 (six) hours as needed. 30 tablet 0   oxyCODONE (OXY IR/ROXICODONE) 5 MG immediate release tablet Take 1-2  tablets (5-10 mg total) by mouth every 4 (four) hours as needed for moderate pain. 30 tablet 0   oxyCODONE-acetaminophen (PERCOCET/ROXICET) 5-325 MG tablet Take 1 tablet by mouth every 6 (six) hours as needed for severe pain. 15 tablet 0   Prenatal Vit-Fe Fumarate-FA (PRENATAL MULTIVITAMIN) TABS tablet Take 1 tablet by mouth daily at 12 noon.     No current facility-administered medications on file prior to visit.    No Known Allergies  Social History:  reports that she has never smoked. She has never used smokeless tobacco. She reports that she does not currently use alcohol. She reports that she does not use drugs.  Family History  Problem Relation Age of Onset   Diabetes Father    Diabetes Maternal Grandmother    Hyperlipidemia Maternal Grandmother    Cancer Maternal Grandmother        peritoneal, ovarian   Hypertension Maternal Grandmother    Diabetes Maternal Grandfather    Hyperlipidemia Maternal Grandfather    Stroke Maternal Grandfather    Heart disease Maternal Grandfather    Heart attack Maternal Grandfather    Arthritis Paternal Grandfather    Diabetes Paternal Grandfather    Anemia Mother    Hypertension Mother    Heart disease Paternal Grandmother    Hypertension Paternal Grandmother  Heart attack Paternal Grandmother    Depression Maternal Aunt    Hearing loss Neg Hx     The following portions of the patient's history were reviewed and updated as appropriate: allergies, current medications, past family history, past medical history, past social history, past surgical history and problem list.  Review of Systems Pertinent items noted in HPI and remainder of comprehensive ROS otherwise negative.  Physical Exam:  LMP 09/30/2023  Physical Exam     Assessment and Plan:   There are no diagnoses linked to this encounter.   Routine preventative health maintenance measures emphasized. Please refer to After Visit Summary for other counseling recommendations.    Follow-up: No follow-ups on file.      Lorriane Shire, MD Obstetrician & Gynecologist, Faculty Practice [facility] for [employer], [facility]

## 2023-10-10 ENCOUNTER — Encounter: Payer: No Typology Code available for payment source | Admitting: Obstetrics and Gynecology

## 2023-12-25 ENCOUNTER — Encounter: Payer: Self-pay | Admitting: Family

## 2023-12-25 ENCOUNTER — Ambulatory Visit (INDEPENDENT_AMBULATORY_CARE_PROVIDER_SITE_OTHER): Admitting: Family

## 2023-12-25 VITALS — BP 111/76 | HR 72 | Temp 98.6°F | Resp 16 | Ht 64.0 in | Wt 199.4 lb

## 2023-12-25 DIAGNOSIS — Z13220 Encounter for screening for lipoid disorders: Secondary | ICD-10-CM | POA: Diagnosis not present

## 2023-12-25 DIAGNOSIS — Z13228 Encounter for screening for other metabolic disorders: Secondary | ICD-10-CM

## 2023-12-25 DIAGNOSIS — Z23 Encounter for immunization: Secondary | ICD-10-CM | POA: Diagnosis not present

## 2023-12-25 DIAGNOSIS — Z5320 Procedure and treatment not carried out because of patient's decision for unspecified reasons: Secondary | ICD-10-CM

## 2023-12-25 DIAGNOSIS — Z7689 Persons encountering health services in other specified circumstances: Secondary | ICD-10-CM

## 2023-12-25 DIAGNOSIS — Z131 Encounter for screening for diabetes mellitus: Secondary | ICD-10-CM

## 2023-12-25 DIAGNOSIS — Z6834 Body mass index (BMI) 34.0-34.9, adult: Secondary | ICD-10-CM | POA: Diagnosis not present

## 2023-12-25 DIAGNOSIS — Z1329 Encounter for screening for other suspected endocrine disorder: Secondary | ICD-10-CM | POA: Diagnosis not present

## 2023-12-25 DIAGNOSIS — Z0000 Encounter for general adult medical examination without abnormal findings: Secondary | ICD-10-CM

## 2023-12-25 DIAGNOSIS — Z1159 Encounter for screening for other viral diseases: Secondary | ICD-10-CM

## 2023-12-25 DIAGNOSIS — R635 Abnormal weight gain: Secondary | ICD-10-CM | POA: Diagnosis not present

## 2023-12-25 DIAGNOSIS — Z130 Encounter for screening for diseases of the blood and blood-forming organs and certain disorders involving the immune mechanism: Secondary | ICD-10-CM

## 2023-12-25 DIAGNOSIS — R21 Rash and other nonspecific skin eruption: Secondary | ICD-10-CM

## 2023-12-25 MED ORDER — PHENTERMINE HCL 15 MG PO CAPS: 15.0000 mg | ORAL_CAPSULE | ORAL | 0 refills | Status: AC

## 2023-12-25 MED ORDER — TRIAMCINOLONE ACETONIDE 0.025 % EX CREA: 1.0000 | TOPICAL_CREAM | Freq: Two times a day (BID) | CUTANEOUS | 2 refills | Status: AC

## 2023-12-25 NOTE — Progress Notes (Signed)
 Spot on right leg, what's a full work up, discuss nutrient  because she gain some weight

## 2023-12-25 NOTE — Progress Notes (Signed)
 Subjective:    Megan Skinner - 33 y.o. female MRN [id number]  Date of birth: 09/01/1990  HPI  Megan Skinner is to establish care and annual physical exam.  Current issues and/or concerns: - Reports she is up to date on cervical cancer screening/women's health maintenance. - Weight gain. She is trying to watch what she eats. She exercises when able to do so. She would like to try a weight loss medication and referral to a weight specialist.  - States rash of right lower leg persisting. Reports itching. Denies red flag symptoms. She has tried over-the-counter medications with minimal relief.  - No further issues/concerns for discussion today.  ROS per HPI    Health Maintenance:  Health Maintenance Due  Topic Date Due   Hepatitis C Screening  Never done   Cervical Cancer Screening (HPV/Pap Cotest)  09/06/2018   HPV VACCINES (2 - 3-dose SCDM series) 01/22/2024     Past Medical History: Patient Active Problem List   Diagnosis Date Noted   Encounter for maternal care for low transverse scar from repeat cesarean delivery 01/02/2020   Cesarean delivery, delivered, current hospitalization 01/02/2020   S/P cesarean section 12/02/2012      Social History   reports that she has never smoked. She has never used smokeless tobacco. She reports that she does not currently use alcohol. She reports that she does not use drugs.   Family History  family history includes Anemia in her mother; Arthritis in her paternal grandfather; Cancer in her maternal grandmother; Depression in her maternal aunt; Diabetes in her father, maternal grandfather, maternal grandmother, and paternal grandfather; Heart attack in her maternal grandfather and paternal grandmother; Heart disease in her maternal grandfather and paternal grandmother; Hyperlipidemia in her maternal grandfather and maternal grandmother; Hypertension in her maternal grandmother, mother, and paternal grandmother; Stroke in her maternal  grandfather.   Medications: reviewed and updated   Objective:   Physical Exam BP 111/76   Pulse 72   Temp 98.6 F (37 C) (Oral)   Resp 16   Ht 5' 4 (1.626 m)   Wt 199 lb 6.4 oz (90.4 kg)   LMP 12/24/2023 (Exact Date)   SpO2 98%   Breastfeeding No   BMI 34.23 kg/m   Physical Exam HENT:     Head: Normocephalic and atraumatic.     Right Ear: Tympanic membrane, ear canal and external ear normal.     Left Ear: Tympanic membrane, ear canal and external ear normal.     Nose: Nose normal.     Mouth/Throat:     Mouth: Mucous membranes are moist.     Pharynx: Oropharynx is clear.   Eyes:     Extraocular Movements: Extraocular movements intact.     Conjunctiva/sclera: Conjunctivae normal.     Pupils: Pupils are equal, round, and reactive to light.   Neck:     Thyroid: No thyroid mass, thyromegaly or thyroid tenderness.   Cardiovascular:     Rate and Rhythm: Normal rate and regular rhythm.     Pulses: Normal pulses.     Heart sounds: Normal heart sounds.  Pulmonary:     Effort: Pulmonary effort is normal.     Breath sounds: Normal breath sounds.  Chest:     Comments: Patient declined. Abdominal:     General: Bowel sounds are normal.     Palpations: Abdomen is soft.  Genitourinary:    Comments: Patient declined.  Musculoskeletal:        General: Normal  range of motion.     Right shoulder: Normal.     Left shoulder: Normal.     Right upper arm: Normal.     Left upper arm: Normal.     Right elbow: Normal.     Left elbow: Normal.     Right forearm: Normal.     Left forearm: Normal.     Right wrist: Normal.     Left wrist: Normal.     Right hand: Normal.     Left hand: Normal.     Cervical back: Normal, normal range of motion and neck supple.     Thoracic back: Normal.     Lumbar back: Normal.     Right hip: Normal.     Left hip: Normal.     Right upper leg: Normal.     Left upper leg: Normal.     Right knee: Normal.     Left knee: Normal.     Right lower  leg: Normal.     Left lower leg: Normal.     Right ankle: Normal.     Left ankle: Normal.     Right foot: Normal.     Left foot: Normal.   Skin:    General: Skin is warm and dry.     Capillary Refill: Capillary refill takes less than 2 seconds.     Findings: Rash present.   Neurological:     General: No focal deficit present.     Mental Status: She is alert and oriented to person, place, and time.   Psychiatric:        Mood and Affect: Mood normal.        Behavior: Behavior normal.        Assessment & Plan:  1. Encounter to establish care (Primary) 2. Annual physical exam - Counseled on 150 minutes of exercise per week as tolerated, healthy eating (including decreased daily intake of saturated fats, cholesterol, added sugars, sodium), STI prevention, and routine healthcare maintenance.  3. Screening for metabolic disorder - Routine screening.  - CMP14+EGFR  4. Screening for deficiency anemia - Routine screening.  - CBC  5. Diabetes mellitus screening - Routine screening.  - Hemoglobin A1c  6. Screening cholesterol level - Routine screening.  - Lipid panel  7. Thyroid disorder screen - Routine screening.  - TSH  8. Need for hepatitis C screening test - Routine screening.  - Hepatitis C Antibody  9. Cervical cancer screening declined - Patient declined.  10. Encounter for weight management 11. BMI 34.0-34.9,adult 12. Weight gain - Phentermine as prescribed. Counseled on medication adherence and adverse effects.  - I did check the [place]  prescription drug database. - Counseled on low-sodium DASH diet and 150 minutes of moderate intensity exercise per week as tolerated to assist with weight management.  - Referral to Medical Weight Management for evaluation/management. - Follow-up with primary provider in 4 weeks or sooner if needed.  - Amb Ref to Medical Weight Management - phentermine 15 MG capsule; Take 1 capsule (15 mg total) by mouth every  morning.  Dispense: 30 capsule; Refill: 0  13. Rash and nonspecific skin eruption - Triamcinolone cream as prescribed. Counseled on medication adherence/adverse effects.  - Referral to Dermatology for evaluation/management.  - Follow-up with primary provider as scheduled. - Ambulatory referral to Dermatology - triamcinolone (KENALOG) 0.025 % cream; Apply 1 Application topically 2 (two) times daily.  Dispense: 60 g; Refill: 2  14. Immunization due - Administered. - HPV 9-valent vaccine,Recombinat  Patient was given clear instructions to go to Emergency Department or return to medical center if symptoms don't improve, worsen, or new problems develop.The patient verbalized understanding.  I discussed the assessment and treatment plan with the patient. The patient was provided an opportunity to ask questions and all were answered. The patient agreed with the plan and demonstrated an understanding of the instructions.   The patient was advised to call back or seek an in-person evaluation if the symptoms worsen or if the condition fails to improve as anticipated.    Greig Drones, NP 12/25/2023, 2:02 PM Primary Care at [facility]

## 2023-12-26 ENCOUNTER — Ambulatory Visit: Payer: Self-pay | Admitting: Family

## 2023-12-26 ENCOUNTER — Telehealth: Payer: Self-pay | Admitting: Family

## 2023-12-26 DIAGNOSIS — Z130 Encounter for screening for diseases of the blood and blood-forming organs and certain disorders involving the immune mechanism: Secondary | ICD-10-CM

## 2023-12-26 LAB — CBC
Hematocrit: 43.1 % (ref 34.0–46.6)
Hemoglobin: 12.3 g/dL (ref 11.1–15.9)
MCH: 22.3 pg — ABNORMAL LOW (ref 26.6–33.0)
MCHC: 28.5 g/dL — ABNORMAL LOW (ref 31.5–35.7)
MCV: 78 fL — ABNORMAL LOW (ref 79–97)
Platelets: 255 10*3/uL (ref 150–450)
RBC: 5.52 x10E6/uL — ABNORMAL HIGH (ref 3.77–5.28)
RDW: 13.9 % (ref 11.7–15.4)
WBC: 5.3 10*3/uL (ref 3.4–10.8)

## 2023-12-26 LAB — CMP14+EGFR
ALT: 10 IU/L (ref 0–32)
AST: 17 IU/L (ref 0–40)
Albumin: 4.4 g/dL (ref 3.9–4.9)
Alkaline Phosphatase: 51 IU/L (ref 44–121)
BUN/Creatinine Ratio: 15 (ref 9–23)
BUN: 12 mg/dL (ref 6–20)
Bilirubin Total: 1 mg/dL (ref 0.0–1.2)
CO2: 24 mmol/L (ref 20–29)
Calcium: 9.1 mg/dL (ref 8.7–10.2)
Chloride: 102 mmol/L (ref 96–106)
Creatinine, Ser: 0.8 mg/dL (ref 0.57–1.00)
Globulin, Total: 3.1 g/dL (ref 1.5–4.5)
Glucose: 88 mg/dL (ref 70–99)
Potassium: 4.2 mmol/L (ref 3.5–5.2)
Sodium: 138 mmol/L (ref 134–144)
Total Protein: 7.5 g/dL (ref 6.0–8.5)
eGFR: 98 mL/min/{1.73_m2} (ref >59)

## 2023-12-26 LAB — HEMOGLOBIN A1C
Est. average glucose Bld gHb Est-mCnc: 117 mg/dL
Hgb A1c MFr Bld: 5.7 % — ABNORMAL HIGH (ref 4.8–5.6)

## 2023-12-26 LAB — LIPID PANEL
Chol/HDL Ratio: 2.9 ratio (ref 0.0–4.4)
Cholesterol, Total: 140 mg/dL (ref 100–199)
HDL: 48 mg/dL (ref >39)
LDL Chol Calc (NIH): 75 mg/dL (ref 0–99)
Triglycerides: 92 mg/dL (ref 0–149)
VLDL Cholesterol Cal: 17 mg/dL (ref 5–40)

## 2023-12-26 LAB — TSH: TSH: 0.742 u[IU]/mL (ref 0.450–4.500)

## 2023-12-26 LAB — HEPATITIS C ANTIBODY: Hep C Virus Ab: NONREACTIVE

## 2023-12-26 NOTE — Telephone Encounter (Unsigned)
 Copied from CRM [id number]. Topic: Clinical - Medical Advice >> Dec 26, 2023 10:32 AM Tiffini S wrote: Reason for CRM: Patient received a missed call for lab results. Explain all lab result note on 12/26/23 to patient. She have concerns about the RBC 5.52 and asked for a call back at  [phone].

## 2023-12-26 NOTE — Telephone Encounter (Signed)
 Schedule lab only appointment to recheck in 4 to 6 weeks.

## 2023-12-27 ENCOUNTER — Encounter (INDEPENDENT_AMBULATORY_CARE_PROVIDER_SITE_OTHER): Payer: Self-pay

## 2023-12-27 NOTE — Telephone Encounter (Signed)
 Called patient and explained that we will do a second blood draw to see what next steps we need to take regarding this. Patient was in agreement  and has a appointment on 7/21

## 2024-01-03 ENCOUNTER — Encounter (INDEPENDENT_AMBULATORY_CARE_PROVIDER_SITE_OTHER): Payer: Self-pay

## 2024-01-03 ENCOUNTER — Institutional Professional Consult (permissible substitution) (INDEPENDENT_AMBULATORY_CARE_PROVIDER_SITE_OTHER): Admitting: Adult Health

## 2024-01-22 ENCOUNTER — Ambulatory Visit

## 2024-08-06 ENCOUNTER — Ambulatory Visit: Admitting: Physician Assistant

## 2024-12-24 ENCOUNTER — Encounter: Admitting: Family
